# Patient Record
Sex: Male | Born: 1960
Health system: Southern US, Community
[De-identification: ages and names within clinical notes are randomized; demographics above are authoritative.]

## PROBLEM LIST (undated history)

## (undated) DIAGNOSIS — T7840XA Allergy, unspecified, initial encounter: Secondary | ICD-10-CM

## (undated) DIAGNOSIS — N183 Chronic kidney disease, stage 3 (moderate): Secondary | ICD-10-CM

## (undated) DIAGNOSIS — F419 Anxiety disorder, unspecified: Secondary | ICD-10-CM

## (undated) DIAGNOSIS — I10 Essential (primary) hypertension: Secondary | ICD-10-CM

## (undated) DIAGNOSIS — E291 Testicular hypofunction: Secondary | ICD-10-CM

## (undated) HISTORY — PX: SHOULDER SURGERY: SHX246

## (undated) HISTORY — DX: Chronic kidney disease, stage 3 (moderate): N18.3

## (undated) HISTORY — PX: COLONOSCOPY: SHX174

## (undated) HISTORY — DX: Testicular hypofunction: E29.1

## (undated) HISTORY — DX: Anxiety disorder, unspecified: F41.9

## (undated) HISTORY — DX: Essential (primary) hypertension: I10

## (undated) HISTORY — DX: Allergy, unspecified, initial encounter: T78.40XA

---

## 2008-11-16 ENCOUNTER — Ambulatory Visit: Payer: Self-pay | Admitting: Family Medicine

## 2008-11-16 DIAGNOSIS — I10 Essential (primary) hypertension: Secondary | ICD-10-CM | POA: Insufficient documentation

## 2008-11-16 DIAGNOSIS — F528 Other sexual dysfunction not due to a substance or known physiological condition: Secondary | ICD-10-CM | POA: Insufficient documentation

## 2008-11-17 ENCOUNTER — Ambulatory Visit: Payer: Self-pay | Admitting: Family Medicine

## 2008-11-19 ENCOUNTER — Encounter: Payer: Self-pay | Admitting: Family Medicine

## 2008-11-24 LAB — CONVERTED CEMR LAB
ALT: 34 units/L (ref 0–53)
AST: 38 units/L — ABNORMAL HIGH (ref 0–37)
Albumin: 3.8 g/dL (ref 3.5–5.2)
Alkaline Phosphatase: 56 units/L (ref 39–117)
BUN: 24 mg/dL — ABNORMAL HIGH (ref 6–23)
Basophils Relative: 0 % (ref 0.0–3.0)
CO2: 34 meq/L — ABNORMAL HIGH (ref 19–32)
Calcium: 9.3 mg/dL (ref 8.4–10.5)
Cholesterol: 93 mg/dL (ref 0–200)
Creatinine, Ser: 1.7 mg/dL — ABNORMAL HIGH (ref 0.4–1.5)
Eosinophils Absolute: 0.1 10*3/uL (ref 0.0–0.7)
Lymphocytes Relative: 33.3 % (ref 12.0–46.0)
MCHC: 33.5 g/dL (ref 30.0–36.0)
MCV: 97.4 fL (ref 78.0–100.0)
Monocytes Absolute: 0.6 10*3/uL (ref 0.1–1.0)
Neutrophils Relative %: 51.4 % (ref 43.0–77.0)
Platelets: 228 10*3/uL (ref 150.0–400.0)
RBC: 5.11 M/uL (ref 4.22–5.81)
Sex Hormone Binding: 28 nmol/L (ref 13–71)
Testosterone Free: 319.3 pg/mL — ABNORMAL HIGH (ref 47.0–244.0)
Testosterone: 1395.28 ng/dL — ABNORMAL HIGH (ref 350.00–890.00)
Testosterone: 1173.28 ng/dL — ABNORMAL HIGH (ref 350–890)
WBC: 5 10*3/uL (ref 4.5–10.5)

## 2008-12-10 ENCOUNTER — Ambulatory Visit: Payer: Self-pay | Admitting: Family Medicine

## 2008-12-12 LAB — CONVERTED CEMR LAB
BUN: 29 mg/dL — ABNORMAL HIGH (ref 6–23)
Calcium: 8.9 mg/dL (ref 8.4–10.5)
Creatinine, Ser: 1.6 mg/dL — ABNORMAL HIGH (ref 0.4–1.5)
GFR calc non Af Amer: 49.09 mL/min (ref >60)
Glucose, Bld: 85 mg/dL (ref 70–99)
Potassium: 4.1 meq/L (ref 3.5–5.1)

## 2008-12-13 LAB — CONVERTED CEMR LAB: Testosterone: 1710.14 ng/dL — ABNORMAL HIGH (ref 350–890)

## 2008-12-14 ENCOUNTER — Ambulatory Visit: Payer: Self-pay | Admitting: Family Medicine

## 2009-01-20 ENCOUNTER — Encounter: Payer: Self-pay | Admitting: Family Medicine

## 2009-01-24 ENCOUNTER — Encounter: Payer: Self-pay | Admitting: Family Medicine

## 2009-01-24 ENCOUNTER — Ambulatory Visit: Payer: Self-pay | Admitting: Nephrology

## 2009-10-28 ENCOUNTER — Ambulatory Visit: Payer: Self-pay | Admitting: Family Medicine

## 2010-03-23 ENCOUNTER — Ambulatory Visit
Admission: RE | Admit: 2010-03-23 | Discharge: 2010-03-23 | Payer: Self-pay | Source: Home / Self Care | Attending: Family Medicine | Admitting: Family Medicine

## 2010-03-23 DIAGNOSIS — J019 Acute sinusitis, unspecified: Secondary | ICD-10-CM | POA: Insufficient documentation

## 2010-03-23 DIAGNOSIS — J309 Allergic rhinitis, unspecified: Secondary | ICD-10-CM | POA: Insufficient documentation

## 2010-04-18 NOTE — Assessment & Plan Note (Signed)
Summary: FEVER ST/MK   Vital Signs:  Patient profile:   50 year old male Height:      68 inches Weight:      206.0 pounds BMI:     31.44 Temp:     98.5 degrees F oral Pulse rate:   76 / minute Pulse rhythm:   regular BP sitting:   140 / 90  (left arm) Cuff size:   large  Vitals Entered By: Benny Lennert CMA Duncan Dull) (October 28, 2009 9:26 AM)  History of Present Illness: Chief complaint fever,chills,scratchy throat,nausea  Acute Visit History:      The patient complains of fever, nasal discharge, and sore throat.  These symptoms began 4 days ago.  He denies constipation, cough, diarrhea, earache, sinus problems, and vomiting.  Other comments include: weakness, fatigue  nausea  right shoulder pain, right rib pain starting this AM..increases with deep breath.  using ibuprofen as needed pain/fever son with similar illness several weeks ago...strep throat.        His highest temperature has been low grade 100.6.  The fever has been chills.        Problems Prior to Update: 1)  Testosterone, Serum, Elevated  (ICD-790.99) 2)  Nonspecific Abnorm Results Kidney Function Study  (ICD-794.4) 3)  Erectile Dysfunction  (ICD-302.72) 4)  Encounter For Long-term Use of Other Medications  (ICD-V58.69) 5)  Screening For Lipoid Disorders  (ICD-V77.91) 6)  Hypertension  (ICD-401.9)  Current Medications (verified): 1)  Lisinopril 40 Mg Tabs (Lisinopril) .Marland Kitchen.. 1 By Mouth Once Daily 2)  Fexofenadine Hcl 180 Mg Tabs (Fexofenadine Hcl) .... One A Day As Needed Allergies 3)  Multivitamins  Caps (Multiple Vitamin) .... One A Day 4)  Vitamin B12 .... One A Day 5)  Fish Oil .... One A Day 6)  Postassium .... One A Day 7)  Androgel Pump 1 % Gel (Testosterone) .... 5.0 Grams Applied Daily To Skin, Disp 1 Month Supply 8)  Vitamin D3 9)  Cranberry 10)  Lysine 11)  Acidopholus 12)  Nasacort Aq 55 Mcg/act Aers (Triamcinolone Acetonide(Nasal)) .... 2 Sprays Each Nostril Daily 13)  Cialis 20 Mg Tabs  (Tadalafil) .Marland Kitchen.. 1 By Mouth Prior To Intercourse  Allergies (verified): No Known Drug Allergies  Past History:  Past medical, surgical, family and social histories (including risk factors) reviewed, and no changes noted (except as noted below).  Past Medical History: Reviewed history from 12/14/2008 and no changes required. HYPERTENSION (ICD-401.9) Erectile dysfunction  NO TESTOSTERONE PER DR. Patsy Lager  Past Surgical History: Reviewed history from 11/16/2008 and no changes required. 2006 Shoulder surgery (tendon)  Family History: Reviewed history from 11/16/2008 and no changes required. Family History Hypertension Family History of Cardiovascular disorder  Social History: Reviewed history from 11/16/2008 and no changes required. 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 Resp:  Denies shortness of breath, sputum productive, and wheezing. GI:  Denies abdominal pain. GU:  Denies dysuria.  Physical Exam  General:  fatigued appearing male in NAD Ears:  External ear exam shows no significant lesions or deformities.  Otoscopic examination reveals clear canals, tympanic membranes are intact bilaterally without bulging, retraction, inflammation or discharge. Hearing is grossly normal bilaterally. Nose:  External nasal examination shows no deformity or inflammation. Nasal mucosa are pink and moist without lesions or exudates. Mouth:  MMM Neck:  no carotid  bruit or thyromegaly no cervical or supraclavicular lymphadenopathy  Chest Wall:  ttp over right chest wall. Lungs:  Normal respiratory effort, chest expands symmetrically. Lungs are clear to auscultation, no crackles or wheezes. Heart:  Normal rate and regular rhythm. S1 and S2 normal without gallop, murmur, click, rub or other extra  sounds. Abdomen:  Bowel sounds positive,abdomen soft and non-tender without masses, organomegaly or hernias noted. Msk:  ttp over left AC joint..neg impingement sign Pulses:  R and L posterior tibial pulses are full and equal bilaterally    Impression & Recommendations:  Problem # 1:  VIRAL INFECTION (ICD-079.99)  Orders: Rapid Strep (16109) Rest, hydration. Discussed symptomatic relief. Call if not improving as expected.  No sign of bacterial infection.   Problem # 2:  SORE THROAT (ICD-462)  Orders: Rapid Strep (60454)  Complete Medication List: 1)  Lisinopril 40 Mg Tabs (Lisinopril) .Marland Kitchen.. 1 by mouth once daily 2)  Fexofenadine Hcl 180 Mg Tabs (Fexofenadine hcl) .... One a day as needed allergies 3)  Multivitamins Caps (Multiple vitamin) .... One a day 4)  Vitamin B12  .... One a day 5)  Fish Oil  .... One a day 6)  Postassium  .... One a day 7)  Androgel Pump 1 % Gel (Testosterone) .... 5.0 grams applied daily to skin, disp 1 month supply 8)  Vitamin D3  9)  Cranberry  10)  Lysine  11)  Acidopholus  12)  Nasacort Aq 55 Mcg/act Aers (Triamcinolone acetonide(nasal)) .... 2 sprays each nostril daily 13)  Cialis 20 Mg Tabs (Tadalafil) .Marland Kitchen.. 1 by mouth prior to intercourse  Patient Instructions: 1)  Push fluids, rest. 2)   Treat temperature with ibuprofen/tylenol. 3)   Call if not imrpoving in 5-7 days. 4)     Current Allergies (reviewed today): No known allergies    Laboratory Results    Other Tests  Rapid Strep: negative  Kit Test Internal QC: Negative   (Normal Range: Negative)

## 2010-04-20 NOTE — Assessment & Plan Note (Signed)
Summary: ?SINUS INFECTION,REFILL MEDS/CLE   Vital Signs:  Patient profile:   50 year old male Height:      68 inches Weight:      210 pounds BMI:     32.05 Temp:     97.7 degrees F oral Pulse rate:   76 / minute Pulse rhythm:   regular BP sitting:   120 / 86  (left arm) Cuff size:   large  Vitals Entered By: Benny Lennert CMA Duncan Dull) (March 23, 2010 12:25 PM)  History of Present Illness: Chief complaint ? sinus infection also needs medication refills  Allergic rhinitis: taking allegra and nasacort, but ran out of nasacort, doing worse. Ran out of nasacort  sinusitis: feels a lot of pressure behind eyes  a couple of days  about one a year.   test def, now getting depot testosterone injections every 2 weeks at urology.   HTN: stable, needs refills on meds   Acute Visit History:      The patient complains of cough, headache, nasal discharge, nausea, and sinus problems.  These symptoms began 4 days ago.  He complains of sinus pressure, teeth aching, ears being blocked, nasal congestion, purulent drainage, and frontal headache.  The patient has had a past history of sinusitis.  He denies sinusitis in the last 2 months.        Urine output has been normal.  He is tolerating clear liquids.        Allergies (verified): No Known Drug Allergies  Past History:  Past medical, surgical, family and social histories (including risk factors) reviewed, and no changes noted (except as noted below).  Past Medical History: HYPERTENSION (ICD-401.9) Erectile dysfunction Hypogonadism (being treated by Urology)  Past Surgical History: Reviewed history from 11/16/2008 and no changes required. 2006 Shoulder surgery (tendon)  Family History: Reviewed history from 11/16/2008 and no changes required. Family History Hypertension Family History of Cardiovascular disorder  Social History: Reviewed history from 11/16/2008 and no changes required. 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 CV: No chest pain or SOB GI: No noted N or V Otherwise, pertinent positives and negatives are noted in the HPI.   Physical Exam  General:  Well-developed,well-nourished,in no acute distress; alert,appropriate and cooperative throughout examination Head:  Normocephalic and atraumatic without obvious abnormalities. No apparent alopecia or balding. sinuses TTP, frontal, max Ears:  External ear exam shows no significant lesions or deformities.  Otoscopic examination reveals clear canals, tympanic membranes are intact bilaterally without bulging, retraction, inflammation or discharge. Hearing is grossly normal bilaterally. Mouth:  Oral mucosa and oropharynx without lesions or exudates.  Teeth in good repair. Neck:  No deformities, masses, or tenderness noted. Lungs:  Normal respiratory effort, chest expands symmetrically. Lungs are clear to auscultation, no crackles or wheezes. Heart:  Normal rate and regular rhythm. S1 and S2 normal without gallop, murmur, click, rub or other extra sounds. Extremities:  No clubbing, cyanosis, edema, or deformity noted with normal full range of motion of all joints.   Neurologic:  alert & oriented X3 and gait normal.   Cervical Nodes:  No lymphadenopathy noted Psych:  Cognition and judgment appear intact. Alert and cooperative with normal attention span and concentration. No apparent delusions, illusions, hallucinations   Impression & Recommendations:  Problem # 1:  SINUSITIS - ACUTE-NOS (ICD-461.9) Assessment New  His updated medication list for this problem includes:    Nasacort Aq 55 Mcg/act Aers (Triamcinolone acetonide(nasal)) .Marland Kitchen... 2 sprays each nostril daily    Azithromycin 250 Mg Tabs (Azithromycin) .Marland Kitchen... 2 by  mouth today and then  1 daily for 4 days  Instructed on treatment. Call if symptoms persist or worsen.   Problem # 2:  HYPERTENSION (ICD-401.9) Assessment: Unchanged  His updated medication list for this problem includes:    Lisinopril 40 Mg Tabs (Lisinopril) .Marland Kitchen... 1 by mouth once daily  Problem # 3:  ALLERGIC RHINITIS, CHRONIC (ICD-477.9) Assessment: Deteriorated  His updated medication list for this problem includes:    Fexofenadine Hcl 180 Mg Tabs (Fexofenadine hcl) ..... One a day as needed allergies    Nasacort Aq 55 Mcg/act Aers (Triamcinolone acetonide(nasal)) .Marland Kitchen... 2 sprays each nostril daily  Problem # 4:  ERECTILE DYSFUNCTION (ICD-302.72)  His updated medication list for this problem includes:    Viagra 100 Mg Tabs (Sildenafil citrate) .Marland Kitchen... 1 by mouth prior to intercourse  Complete Medication List: 1)  Lisinopril 40 Mg Tabs (Lisinopril) .Marland Kitchen.. 1 by mouth once daily 2)  Fexofenadine Hcl 180 Mg Tabs (Fexofenadine hcl) .... One a day as needed allergies 3)  Multivitamins Caps (Multiple vitamin) .... One a day 4)  Vitamin B12  .... One a day 5)  Fish Oil  .... One a day 6)  Postassium  .... One a day 7)  Vitamin D3  8)  Cranberry  9)  Lysine  10)  Acidopholus  11)  Nasacort Aq 55 Mcg/act Aers (Triamcinolone acetonide(nasal)) .... 2 sprays each nostril daily 12)  Viagra 100 Mg Tabs (Sildenafil citrate) .Marland Kitchen.. 1 by mouth prior to intercourse 13)  Depo-testosterone 200 Mg/ml Oil (Testosterone cypionate) .Marland Kitchen.. 1 shot every 2 weeks (done by urology) 14)  Azithromycin 250 Mg Tabs (Azithromycin) .... 2 by  mouth today and then 1 daily for 4 days Prescriptions: AZITHROMYCIN 250 MG  TABS (AZITHROMYCIN) 2 by  mouth today and then 1 daily for 4 days  #6 x 0   Entered and Authorized by:   Hannah Beat MD   Signed by:   Hannah Beat MD on 03/23/2010   Method used:   Electronically to        [facility] [phone]* (retail)       [address] [place], [place]  [address]        Ph: [phone] or [phone]       Fax: [phone]   RxID:   [id number] VIAGRA 100 MG TABS (SILDENAFIL CITRATE) 1 by mouth prior to intercourse  #4 x 11   Entered and Authorized by:   Hannah Beat MD   Signed by:   Hannah Beat MD on 03/23/2010   Method used:   Electronically to        [facility] [phone]* (retail)       [address] [place], [place]  [address]       Ph: [phone] or [phone]       Fax: [phone]   RxID:   [id number] NASACORT AQ 55 MCG/ACT AERS (TRIAMCINOLONE ACETONIDE(NASAL)) 2 sprays each nostril daily  #1 x 11   Entered and Authorized by:   Hannah Beat MD   Signed by:  Hannah Beat MD on 03/23/2010   Method used:   Electronically to        [facility] [phone]* (retail)       [address] [place], [place]  [address]       Ph: [phone] or [phone]       Fax: [phone]   RxID:   [id number] FEXOFENADINE HCL 180 MG TABS (FEXOFENADINE HCL) one a day as needed allergies  #30 x 11   Entered and Authorized by:   Hannah Beat MD   Signed by:   Hannah Beat MD on 03/23/2010   Method used:   Electronically to        [facility] [phone]* (retail)       [address] [place], [place]  [address]       Ph: [phone] or [phone]       Fax: [phone]   RxID:   [id number] LISINOPRIL 40 MG TABS (LISINOPRIL) 1 by mouth once daily  #30 Tablet x 11   Entered and Authorized by:   Hannah Beat MD   Signed by:   Hannah Beat MD on 03/23/2010   Method used:   Electronically to        [facility] [phone]* (retail)       [address] [place], [place]  [address]       Ph: [phone] or [phone]       Fax: [phone]   RxID:   [id number]    Orders Added: 1)  Est. Patient Level IV [[address]]    Current Allergies (reviewed today): No known allergies

## 2010-11-07 ENCOUNTER — Ambulatory Visit (INDEPENDENT_AMBULATORY_CARE_PROVIDER_SITE_OTHER): Payer: 59 | Admitting: Family Medicine

## 2010-11-07 ENCOUNTER — Encounter: Payer: Self-pay | Admitting: Family Medicine

## 2010-11-07 VITALS — BP 140/86 | HR 82 | Temp 97.7°F | Ht 68.0 in | Wt 211.1 lb

## 2010-11-07 DIAGNOSIS — F5104 Psychophysiologic insomnia: Secondary | ICD-10-CM | POA: Insufficient documentation

## 2010-11-07 DIAGNOSIS — G4700 Insomnia, unspecified: Secondary | ICD-10-CM | POA: Insufficient documentation

## 2010-11-07 DIAGNOSIS — F410 Panic disorder [episodic paroxysmal anxiety] without agoraphobia: Secondary | ICD-10-CM | POA: Insufficient documentation

## 2010-11-07 MED ORDER — LORAZEPAM 0.5 MG PO TABS: 0.5000 mg | ORAL_TABLET | Freq: Three times a day (TID) | ORAL | Status: AC | PRN

## 2010-11-07 NOTE — Patient Instructions (Addendum)
Insomnia:  Melatonin: 3 - 6 mg, take 1 hour before bed, the same time each night. This is safe to to every day and can help regulate sleep cycle.  Benadryl: up to 50 mg 30 minutes before sleep Or Unisom (Doxylamine) - 1 tablet 30 minutes before sleep -- These are both fairly safe medications, anti-histamines, and can be taken at night before bed.

## 2010-11-07 NOTE — Progress Notes (Signed)
  Subjective:    Patient ID: Max Peters, male    DOB: May 30, 1960, 50 y.o.   MRN: [id number]  HPI  Max Peters, a 50 y.o. male presents today in the office for the following:    For about six months, will wake up in the middle of the night. Will wake up around 3 oclock. Will wake up and feel all anxious. Nervous, like cannot breath. Will get up and go, get out of the bed. Sit in the easy chair. When sleepy in the chair -- about a couple of times a moth. For the past 2 days has happened 2 nights in a row, woke up at 3. Panicky, like cannot breath. Anxious -- almost like chest pain. When calms down, it will be OK. Has been ongoing for about 2 times a month for 6 months  Took a promotion about six months ago. Has happened two night in the -   Took a combo with GABA, melatonin and others that did not help last night  The PMH, PSH, Social History, Family History, Medications, and allergies have been reviewed in [facility], and have been updated if relevant.  Review of Systems ROS: GEN: No acute illnesses, no fevers, chills. GI: No n/v/d, eating normally Pulm: No SOB Interactive and getting along well at home.  Otherwise, ROS is as per the HPI.     Objective:   Physical Exam   Physical Exam  Blood pressure 140/86, pulse 82, temperature 97.7 F (36.5 C), temperature source Oral, height 5\' 8"  (1.727 m), weight 211 lb 1.9 oz (95.763 kg), SpO2 99.00%.  GEN: WDWN, NAD, Non-toxic, A & O x 3 HEENT: Atraumatic, Normocephalic. Neck supple. No masses, No LAD. Ears and Nose: No external deformity. EXTR: No c/c/e NEURO Normal gait.  PSYCH: Normally interactive. Conversant. Not depressed or anxious appearing.  Calm demeanor.       Assessment & Plan:   1. Panic attack   2. Insomnia    Classic panic attacks -- happening in night. Some associated mild insomnia. Instinctively has done some basic sleep hygiene.  Benadryl or Unisom the next few days to break cycle before bed.  Prn ativan for acute  attacks.   If worsens could use daily med

## 2010-11-30 ENCOUNTER — Encounter: Payer: Self-pay | Admitting: Family Medicine

## 2010-11-30 ENCOUNTER — Ambulatory Visit (INDEPENDENT_AMBULATORY_CARE_PROVIDER_SITE_OTHER): Payer: 59 | Admitting: Family Medicine

## 2010-11-30 VITALS — BP 120/76 | HR 84 | Temp 98.7°F | Wt 210.2 lb

## 2010-11-30 DIAGNOSIS — R3911 Hesitancy of micturition: Secondary | ICD-10-CM

## 2010-11-30 DIAGNOSIS — R300 Dysuria: Secondary | ICD-10-CM | POA: Insufficient documentation

## 2010-11-30 LAB — POCT URINALYSIS DIPSTICK
Glucose, UA: NEGATIVE
Nitrite, UA: POSITIVE
Urobilinogen, UA: 0.2
pH, UA: 6

## 2010-11-30 MED ORDER — SULFAMETHOXAZOLE-TMP DS 800-160 MG PO TABS: 1.0000 | ORAL_TABLET | Freq: Two times a day (BID) | ORAL | Status: AC

## 2010-11-30 NOTE — Progress Notes (Signed)
  Subjective:    Patient ID: Max Peters, male    DOB: 22-Dec-1960, 50 y.o.   MRN: [id number]  HPI CC: urinary sxs  4d h/o urinary sxs.  Over weekend on feet all day.  Monday morning when got up to work, left foot was very swollen.  Went to work still, then to gym.  Tuesday morning swelling improved.  Went to work, appetite decreased.  After lunch started with subjective fever, chills, lethargic, flu like sxs, trouble voiding with dysuria and incomplete emptying, testicular pain and abdominal pain described as gas pains improved with passing gas.  Went home and slept.  Tried ibuprofen and started azo pills.  Azo helped dysuria.  Stream back to normal.  Last night found leftover cipro 750mg  at home as well as took ibuprofen, azo again.  This am awoke with improved but still present abd pain, testicular pain, mild back pain.  Starting to feel better.  Several days constipated.  Has started having normal BMs again.  Tends to have 3-4 bm/day.  No blood in stool.  Tmax measured 99.5.  No significant back pain, no nausea/vomiting, no urethral discharge, monogamous relationship.  Diet high in protein, eg egg whites, protein bars.  H/o hypogonadism on testosterone cypionate 100mg  injection weekly.  To see Dr. Caryl Never at [facility] on Monday for f/u hypogonadism.  Never told has BPH.  Saw podiatrist yesterday, told normal foot exam.  Review of Systems Per HPI    Objective:   Physical Exam  Nursing note and vitals reviewed. Constitutional: He appears well-developed and well-nourished. No distress.  HENT:  Head: Normocephalic and atraumatic.  Mouth/Throat: Oropharynx is clear and moist. No oropharyngeal exudate.  Cardiovascular: Normal rate, regular rhythm and intact distal pulses.   No murmur heard. Pulmonary/Chest: Effort normal and breath sounds normal. No respiratory distress. He has no wheezes. He has no rales.  Abdominal: Soft. Bowel sounds are normal. He exhibits no distension and no mass.  There is no hepatosplenomegaly. There is no tenderness. There is no rigidity, no rebound, no guarding, no CVA tenderness and negative Murphy's sign. Hernia confirmed negative in the right inguinal area and confirmed negative in the left inguinal area.  Genitourinary: Rectum normal, prostate normal and penis normal. Rectal exam shows no external hemorrhoid, no fissure, no mass and anal tone normal. Right testis shows no mass, no swelling and no tenderness. Left testis shows no mass, no swelling and no tenderness. Circumcised. No phimosis.       Some hardened prostate. Some decreased testicle mass.  Musculoskeletal: He exhibits no edema.  Lymphadenopathy:       Right: No inguinal adenopathy present.       Left: No inguinal adenopathy present.  Skin: Skin is warm and dry. No rash noted.          Assessment & Plan:

## 2010-11-30 NOTE — Assessment & Plan Note (Addendum)
Lower tract sxs with abd pain, subjective fevers/chills. UA likely with infection, micro not too impressive, possibly 2/2 abx use yesterday.Marland Kitchen   Ucx sent.  No significant tenderness with palpation of prostate. Will treat as prostatitis with 2wk course of bactrim.  Continue NSAID. 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 Update if not improving as expected.

## 2010-11-30 NOTE — Patient Instructions (Signed)
Sounds like prostate infection.  Push fluids and plenty of rest.  May use ibuprofen for inflammation. Treat with bactrim DS twice daily for 2 weeks. Urine culture sent, we will send to your urologist when we receive it. Let us know if not improving as expected, or any worsening.

## 2010-12-18 ENCOUNTER — Ambulatory Visit: Payer: Self-pay | Admitting: Urology

## 2010-12-20 ENCOUNTER — Ambulatory Visit (INDEPENDENT_AMBULATORY_CARE_PROVIDER_SITE_OTHER): Payer: 59 | Admitting: Family Medicine

## 2010-12-20 ENCOUNTER — Encounter: Payer: Self-pay | Admitting: Family Medicine

## 2010-12-20 DIAGNOSIS — R059 Cough, unspecified: Secondary | ICD-10-CM | POA: Insufficient documentation

## 2010-12-20 DIAGNOSIS — R05 Cough: Secondary | ICD-10-CM

## 2010-12-20 DIAGNOSIS — G4700 Insomnia, unspecified: Secondary | ICD-10-CM

## 2010-12-20 DIAGNOSIS — H6120 Impacted cerumen, unspecified ear: Secondary | ICD-10-CM

## 2010-12-20 MED ORDER — BENZONATATE 100 MG PO CAPS: 100.0000 mg | ORAL_CAPSULE | Freq: Four times a day (QID) | ORAL | Status: DC | PRN

## 2010-12-20 NOTE — Progress Notes (Signed)
  Subjective:    Patient ID: Max Peters, male    DOB: 1960/05/31, 50 y.o.   MRN: [id number]  HPI Pt of Dr Cyndie Chime here as acute appt for cough. He has had a cough since Sun. Last night he was up all night coughing. He has used Mucinex. His OTC meds have not helped.  He denies fever or chills, headache, ear pain, rhinitis. He has had some ST this AM and occas at night since Sun. He minimally produces yellow sputum when coughing but coughs a lot. He demies N/V or diarrhea.  Sleep last night worse worse than usual.    Review of SystemsNoncontributory except as above.        Objective:   Physical Exam  Constitutional: He appears well-developed and well-nourished. No distress.  HENT:  Head: Normocephalic and atraumatic.  Nose: Nose normal.  Mouth/Throat: Oropharynx is clear and moist.       Completely occluded canals bilat with yellow/tan cerumen.  Eyes: Conjunctivae and EOM are normal. Pupils are equal, round, and reactive to light. Right eye exhibits no discharge. Left eye exhibits no discharge.  Neck: Normal range of motion. Neck supple.  Cardiovascular: Normal rate and regular rhythm.   Pulmonary/Chest: Effort normal and breath sounds normal. He has no wheezes.  Lymphadenopathy:    He has no cervical adenopathy.  Skin: He is not diaphoretic.          Assessment & Plan:

## 2010-12-20 NOTE — Assessment & Plan Note (Signed)
See instructions.  Doubt Lisinopril part of the problem.

## 2010-12-20 NOTE — Assessment & Plan Note (Addendum)
Discussed at length. Pt clearly wanted an acute medication other than Melatonin for sleep. He was not terribly receptive to sleep hygiene efforts or sleep cycle readjustment technique. I did discuss this at length.

## 2010-12-20 NOTE — Assessment & Plan Note (Signed)
Bilat. Discussed home irrigation technique.

## 2010-12-20 NOTE — Patient Instructions (Addendum)
Take Guaifenesin (400mg ), take 11/2 tabs by mouth AM and NOON. Get GUAIFENESIN by  [facility] and getting MUCOUS RELIEF EXPECTORANT/CONGESTION. DO NOT GET MUCINEX (Timed Release Guaifenesin)  Drink fluids. Take Tessalon at night. Keep lozenge in mouth all day for next week. Gargle with 30ccs of warm salt water every half hour for 2 days as able..  Irrigate canals as instructed to clear cerumen and keep clear.

## 2010-12-27 ENCOUNTER — Encounter: Payer: Self-pay | Admitting: Family Medicine

## 2010-12-27 ENCOUNTER — Ambulatory Visit (INDEPENDENT_AMBULATORY_CARE_PROVIDER_SITE_OTHER): Payer: 59 | Admitting: Family Medicine

## 2010-12-27 VITALS — BP 130/82 | HR 75 | Temp 97.6°F | Ht 68.0 in | Wt 204.1 lb

## 2010-12-27 DIAGNOSIS — G4700 Insomnia, unspecified: Secondary | ICD-10-CM

## 2010-12-27 DIAGNOSIS — F410 Panic disorder [episodic paroxysmal anxiety] without agoraphobia: Secondary | ICD-10-CM

## 2010-12-27 DIAGNOSIS — J209 Acute bronchitis, unspecified: Secondary | ICD-10-CM

## 2010-12-27 MED ORDER — AZITHROMYCIN 250 MG PO TABS: ORAL_TABLET | ORAL | Status: AC

## 2010-12-27 MED ORDER — HYDROCODONE-HOMATROPINE 5-1.5 MG/5ML PO SYRP: ORAL_SOLUTION | ORAL | Status: AC

## 2010-12-27 MED ORDER — AMITRIPTYLINE HCL 10 MG PO TABS: 10.0000 mg | ORAL_TABLET | Freq: Every day | ORAL | Status: DC

## 2010-12-27 NOTE — Patient Instructions (Signed)
F/u 4-6 weeks

## 2010-12-27 NOTE — Progress Notes (Signed)
  Subjective:    Patient ID: Max Peters, male    DOB: February 08, 1961, 50 y.o.   MRN: [id number]  HPI  Bonner Larue, a 50 y.o. male presents today in the office for the following:    Sick, coughing and productive of sputum. Yellowish in coloration. Having minimal nasal congestion at this point. No ear pain or sore throat. > 10 days now, really bad at night. Already having some sleeping problems. Had een taking some mucinex, but did not help. Also took some nyquil. Plain robitussin, then take in the morning and felt bad still in the afternoon. Using some Tessalon.  Still coughing up some phlegm - some yellowish tint. Cough is more productive.   He continues to have some significant difficulty sleeping, some panic attacks in the middle of the night. He does sleep with the television on with no sound. Last time, given some Ativan to use when he is having an acute panic attack, and he didn't really think that it helped very much. He has not done any counseling. No antidepressants. He has and still continues to exercise very frequently.  The PMH, PSH, Social History, Family History, Medications, and allergies have been reviewed in [facility], and have been updated if relevant.   Review of Systems ROS: GEN: Acute illness details above GI: Tolerating PO intake GU: maintaining adequate hydration and urination Pulm: No SOB Interactive and getting along well at home.  Otherwise, ROS is as per the HPI.     Objective:   Physical Exam   Physical Exam  Blood pressure 130/82, pulse 75, temperature 97.6 F (36.4 C), temperature source Oral, height 5\' 8"  (1.727 m), weight 204 lb 1.9 oz (92.588 kg), SpO2 98.00%.  GEN: A and O x 3. WDWN. NAD.    ENT: Nose clear, ext NML.  No LAD.  No JVD.  TM's clear. Oropharynx clear.  PULM: Normal WOB, no distress. No crackles, wheezes, rhonchi. CV: RRR, no M/G/R, No rubs, No JVD.   ABD: S, NT, ND, + BS. No rebound. No guarding. No HSM.   EXT: warm and well-perfused, No  c/c/e. PSYCH: Pleasant and conversant.       Assessment & Plan:   1. Bronchitis, acute  HYDROcodone-homatropine (HYCODAN) 5-1.5 MG/5ML syrup, azithromycin (ZITHROMAX Z-PAK) 250 MG tablet  2. Panic attack    3. Insomnia      Ongoing symptoms greater than 10 days, treat and cover for atypical organisms. Reasonable to use Hycodan at night.  He continues to have problems sleeping, occasional panic attacks at nighttime mostly. A great deal of stress at work. He is doing a lot of things right in terms of his exercise patterns, and he is at least aware of sleep hygiene. He is doing everything perfectly, but he is trying. We're to do a trial of low-dose amitriptyline. Certainly, think that going up to 25 in short order is appropriate also. If he feels some benefit, we could titrate up the dose.

## 2011-03-24 ENCOUNTER — Other Ambulatory Visit: Payer: Self-pay | Admitting: Family Medicine

## 2011-04-02 ENCOUNTER — Other Ambulatory Visit: Payer: Self-pay | Admitting: Family Medicine

## 2011-04-26 ENCOUNTER — Other Ambulatory Visit: Payer: Self-pay | Admitting: Family Medicine

## 2011-06-16 ENCOUNTER — Other Ambulatory Visit: Payer: Self-pay | Admitting: Family Medicine

## 2011-09-03 ENCOUNTER — Other Ambulatory Visit: Payer: Self-pay | Admitting: Family Medicine

## 2011-09-04 NOTE — Telephone Encounter (Signed)
refilled 

## 2011-09-26 ENCOUNTER — Telehealth: Payer: Self-pay | Admitting: *Deleted

## 2011-09-26 MED ORDER — FLUTICASONE PROPIONATE 50 MCG/ACT NA SUSP: 2.0000 | Freq: Every day | NASAL | Status: DC

## 2011-09-26 NOTE — Telephone Encounter (Signed)
Changed to flonase

## 2011-09-26 NOTE — Telephone Encounter (Signed)
Ok to change to flonase if ok with patient

## 2011-09-26 NOTE — Telephone Encounter (Signed)
Patient insurance will not cover nasacort and it is 144.00 out of pocket. Pharmacy wants to know if we can change to another nasal steroid.

## 2012-01-15 DIAGNOSIS — N529 Male erectile dysfunction, unspecified: Secondary | ICD-10-CM | POA: Insufficient documentation

## 2012-02-08 ENCOUNTER — Other Ambulatory Visit: Payer: Self-pay | Admitting: Family Medicine

## 2012-04-27 ENCOUNTER — Other Ambulatory Visit: Payer: Self-pay | Admitting: Family Medicine

## 2012-05-31 ENCOUNTER — Other Ambulatory Visit: Payer: Self-pay | Admitting: Family Medicine

## 2012-07-03 ENCOUNTER — Other Ambulatory Visit: Payer: Self-pay | Admitting: Family Medicine

## 2012-07-14 DIAGNOSIS — E291 Testicular hypofunction: Secondary | ICD-10-CM | POA: Insufficient documentation

## 2012-07-14 DIAGNOSIS — N400 Enlarged prostate without lower urinary tract symptoms: Secondary | ICD-10-CM | POA: Insufficient documentation

## 2012-07-29 ENCOUNTER — Other Ambulatory Visit: Payer: Self-pay | Admitting: Family Medicine

## 2012-08-01 ENCOUNTER — Other Ambulatory Visit: Payer: Self-pay | Admitting: Family Medicine

## 2012-09-13 ENCOUNTER — Other Ambulatory Visit: Payer: Self-pay | Admitting: Family Medicine

## 2012-10-09 ENCOUNTER — Other Ambulatory Visit: Payer: Self-pay | Admitting: Family Medicine

## 2012-10-10 NOTE — Telephone Encounter (Signed)
Patient not seen since 2012 and has been told to schedule appt but has not  Done so is it okay to refill?

## 2012-10-10 NOTE — Addendum Note (Signed)
Addended by: Consuello Masse on: 10/10/2012 07:40 AM   Modules accepted: Orders

## 2012-10-12 NOTE — Telephone Encounter (Signed)
Can you fill 30, 1 refill, just tell Max Peters he needs to come in for physical and i haven't seen him since 2012.

## 2012-10-13 ENCOUNTER — Telehealth: Payer: Self-pay | Admitting: Family Medicine

## 2012-10-13 MED ORDER — LISINOPRIL 40 MG PO TABS: ORAL_TABLET | ORAL | Status: DC

## 2012-10-13 NOTE — Telephone Encounter (Signed)
[facility] Triage Call Report Triage Record Num: [id number] Operator: Donnella Sham Patient Name: Meryl Ponder Call Date & Time: 10/10/2012 5:45:07PM Patient Phone: [phone] PCP: Patient Gender: Male PCP Fax : Patient DOB: 03/02/1961 Practice Name: [facility] Reason for Call: Caller: Yael/Patient; PCP: Hannah Beat (Family Practice); CB#: [phone]; Call regarding Medication refill; took last Lisinopril today; had pharmacy send over a request and said an OV would be needed; checked epic and found last OV 12/29/10; Fredrik Cove said pharmacy said they could give him enough pills to get through the w/e; will call office 10/13/12 for an OV for a full refill Protocol(s) Used: Office Note Recommended Outcome per Protocol: Information Noted and Sent to Office Reason for Outcome: Caller information to office Care Advice: ~ 10/10/2012 5:53:08PM Page 1 of 1 [id number]

## 2012-10-13 NOTE — Telephone Encounter (Signed)
Patient advised and will call for appt. Patients medication sent to pharmacy

## 2012-10-13 NOTE — Addendum Note (Signed)
Addended by: Consuello Masse on: 10/13/2012 11:09 AM   Modules accepted: Orders

## 2012-12-11 ENCOUNTER — Telehealth: Payer: Self-pay | Admitting: Family Medicine

## 2012-12-11 NOTE — Telephone Encounter (Signed)
CPE scheduled for 01/07/2013 @ 8am with Dr. Patsy Lager.

## 2013-01-05 ENCOUNTER — Other Ambulatory Visit (INDEPENDENT_AMBULATORY_CARE_PROVIDER_SITE_OTHER): Payer: 59

## 2013-01-05 DIAGNOSIS — Z125 Encounter for screening for malignant neoplasm of prostate: Secondary | ICD-10-CM

## 2013-01-05 DIAGNOSIS — Z79899 Other long term (current) drug therapy: Secondary | ICD-10-CM

## 2013-01-05 DIAGNOSIS — Z13220 Encounter for screening for lipoid disorders: Secondary | ICD-10-CM

## 2013-01-05 LAB — CBC WITH DIFFERENTIAL/PLATELET
Basophils Relative: 0.4 % (ref 0.0–3.0)
Eosinophils Relative: 3.7 % (ref 0.0–5.0)
HCT: 46.3 % (ref 39.0–52.0)
Hemoglobin: 15.8 g/dL (ref 13.0–17.0)
MCV: 95.9 fl (ref 78.0–100.0)
Monocytes Absolute: 0.7 10*3/uL (ref 0.1–1.0)
Monocytes Relative: 13.8 % — ABNORMAL HIGH (ref 3.0–12.0)
Neutrophils Relative %: 54.2 % (ref 43.0–77.0)
Platelets: 249 10*3/uL (ref 150.0–400.0)

## 2013-01-05 LAB — LIPID PANEL
Cholesterol: 109 mg/dL (ref 0–200)
HDL: 45.5 mg/dL (ref >39.00)
LDL Cholesterol: 57 mg/dL (ref 0–99)
Triglycerides: 33 mg/dL (ref 0.0–149.0)

## 2013-01-05 LAB — HEPATIC FUNCTION PANEL
ALT: 31 U/L (ref 0–53)
AST: 35 U/L (ref 0–37)
Bilirubin, Direct: 0.1 mg/dL (ref 0.0–0.3)
Total Bilirubin: 0.8 mg/dL (ref 0.3–1.2)
Total Protein: 6.5 g/dL (ref 6.0–8.3)

## 2013-01-05 LAB — PSA: PSA: 3.48 ng/mL (ref 0.10–4.00)

## 2013-01-05 LAB — BASIC METABOLIC PANEL
BUN: 24 mg/dL — ABNORMAL HIGH (ref 6–23)
CO2: 32 mEq/L (ref 19–32)
Chloride: 102 mEq/L (ref 96–112)
GFR: 50.1 mL/min — ABNORMAL LOW (ref >60.00)
Potassium: 4.9 mEq/L (ref 3.5–5.1)
Sodium: 138 mEq/L (ref 135–145)

## 2013-01-07 ENCOUNTER — Encounter: Payer: Self-pay | Admitting: Family Medicine

## 2013-01-07 ENCOUNTER — Encounter: Payer: Self-pay | Admitting: Internal Medicine

## 2013-01-07 ENCOUNTER — Ambulatory Visit (INDEPENDENT_AMBULATORY_CARE_PROVIDER_SITE_OTHER): Payer: 59 | Admitting: Family Medicine

## 2013-01-07 VITALS — BP 120/90 | HR 76 | Temp 98.3°F | Ht 67.0 in | Wt 190.5 lb

## 2013-01-07 DIAGNOSIS — Z0000 Encounter for general adult medical examination without abnormal findings: Secondary | ICD-10-CM

## 2013-01-07 DIAGNOSIS — Z23 Encounter for immunization: Secondary | ICD-10-CM

## 2013-01-07 DIAGNOSIS — Z1211 Encounter for screening for malignant neoplasm of colon: Secondary | ICD-10-CM

## 2013-01-07 MED ORDER — AMITRIPTYLINE HCL 10 MG PO TABS: ORAL_TABLET | ORAL | Status: DC

## 2013-01-07 MED ORDER — FLUTICASONE PROPIONATE 50 MCG/ACT NA SUSP: 2.0000 | Freq: Every day | NASAL | Status: DC

## 2013-01-07 MED ORDER — LISINOPRIL 40 MG PO TABS: ORAL_TABLET | ORAL | Status: DC

## 2013-01-07 NOTE — Progress Notes (Signed)
Date:  01/07/2013   Name:  Max Peters   DOB:  [birth year]   MRN:  [id number] Gender: male Age: 52 y.o.  Primary Physician:  Hannah Beat, MD   Chief Complaint: Annual Exam   History of Present Illness:  Max Peters is a 52 y.o. pleasant patient who presents with the following:  CPX: 128/85  Colonoscopy  Preventative Health Maintenance Visit:  Health Maintenance Summary Reviewed and updated, unless pt declines services.  Tobacco History Reviewed. Alcohol: about 1 a day Exercise Habits: works out most every day STD concerns: no risk or activity to increase risk Drug Use: None Encouraged self-testicular check  Health Maintenance  Topic Date Due  . Tetanus/tdap  01/14/1979  . Colonoscopy  01/13/2010  . Influenza Vaccine  10/17/2012    Labs reviewed with the patient.  Results for orders placed in visit on 01/05/13  LIPID PANEL      Result Value Range   Cholesterol 109  0 - 200 mg/dL   Triglycerides 46.9  0.0 - 149.0 mg/dL   HDL 62.95  >28.41 mg/dL   VLDL 6.6  0.0 - 32.4 mg/dL   LDL Cholesterol 57  0 - 99 mg/dL   Total CHOL/HDL Ratio 2    CBC WITH DIFFERENTIAL      Result Value Range   WBC 4.9  4.5 - 10.5 K/uL   RBC 4.82  4.22 - 5.81 Mil/uL   Hemoglobin 15.8  13.0 - 17.0 g/dL   HCT 40.1  02.7 - 25.3 %   MCV 95.9  78.0 - 100.0 fl   MCHC 34.1  30.0 - 36.0 g/dL   RDW 66.4 (*) 40.3 - 47.4 %   Platelets 249.0  150.0 - 400.0 K/uL   Neutrophils Relative % 54.2  43.0 - 77.0 %   Lymphocytes Relative 27.9  12.0 - 46.0 %   Monocytes Relative 13.8 (*) 3.0 - 12.0 %   Eosinophils Relative 3.7  0.0 - 5.0 %   Basophils Relative 0.4  0.0 - 3.0 %   Neutro Abs 2.7  1.4 - 7.7 K/uL   Lymphs Abs 1.4  0.7 - 4.0 K/uL   Monocytes Absolute 0.7  0.1 - 1.0 K/uL   Eosinophils Absolute 0.2  0.0 - 0.7 K/uL   Basophils Absolute 0.0  0.0 - 0.1 K/uL  HEPATIC FUNCTION PANEL      Result Value Range   Total Bilirubin 0.8  0.3 - 1.2 mg/dL   Bilirubin, Direct 0.1  0.0 - 0.3 mg/dL   Alkaline Phosphatase 61  39 - 117 U/L   AST 35  0 - 37 U/L   ALT 31  0 - 53 U/L   Total Protein 6.5  6.0 - 8.3 g/dL   Albumin 3.6  3.5 - 5.2 g/dL  BASIC METABOLIC PANEL      Result Value Range   Sodium 138  135 - 145 mEq/L   Potassium 4.9  3.5 - 5.1 mEq/L   Chloride 102  96 - 112 mEq/L   CO2 32  19 - 32 mEq/L   Glucose, Bld 99  70 - 99 mg/dL   BUN 24 (*) 6 - 23 mg/dL   Creatinine, Ser 1.6 (*) 0.4 - 1.5 mg/dL   Calcium 9.2  8.4 - 25.9 mg/dL   GFR 56.38 (*) >75.64 mL/min  PSA      Result Value Range   PSA 3.48  0.10 - 4.00 ng/mL     Patient Active Problem List   Diagnosis  Date Noted  . Panic attack 11/07/2010  . Insomnia 11/07/2010  . ALLERGIC RHINITIS, CHRONIC 03/23/2010  . HYPERTENSION 11/16/2008    Past Medical History  Diagnosis Date  . Hypertension   . Hypogonadism male     Past Surgical History  Procedure Laterality Date  . Shoulder surgery      History   Social History  . Marital Status: Married    Spouse Name: N/A    Number of Children: N/A  . Years of Education: N/A   Occupational History  . [employer]    Social History Main Topics  . Smoking status: Never Smoker   . Smokeless tobacco: Never Used  . Alcohol Use: Yes     Comment: occassionally  . Drug Use: No  . Sexual Activity: Not on file   Other Topics Concern  . Not on file   Social History Narrative   Regular exercise--yes    No family history on file.  No Known Allergies  Medication list has been reviewed and updated.  Outpatient Prescriptions Prior to Visit  Medication Sig Dispense Refill  . amitriptyline (ELAVIL) 10 MG tablet TAKE 1 TABLET AT BEDTIME  30 tablet  5  . fluticasone (FLONASE) 50 MCG/ACT nasal spray Place 2 sprays into the nose daily.  16 g  6  . lisinopril (PRINIVIL,ZESTRIL) 40 MG tablet TAKE 1 TABLET BY MOUTH EVERY DAY (NEEDS APPOINTMENT)  30 tablet  0  . testosterone cypionate (DEPO-TESTOSTERONE) 200 MG/ML injection Inject 100 mg into  the muscle once a week.       Marland Kitchen VIAGRA 100 MG tablet TAKE 1 TABLET BY MOUTH ONE HOUR PRIOR TO INTERCOURSE  4 tablet  6  . ibuprofen (ADVIL,MOTRIN) 800 MG tablet Take 800 mg by mouth every 8 (eight) hours as needed.        . [facility] ALLERGY RELIEF 180 MG tablet TAKE 1 TABLET EVERY DAY AS NEEDED FOR ALLERGY  30 tablet  9  . Melatonin (MELADOX) 3 MG TBCR Take 2 by mouth at bedtime       . NASACORT AQ 55 MCG/ACT nasal inhaler 2 SPRAYS EACH NOSTRIL DAILY  16.5 g  10   No facility-administered medications prior to visit.    Review of Systems:   General: Denies fever, chills, sweats. No significant weight loss. Eyes: Denies blurring,significant itching ENT: Denies earache, sore throat, and hoarseness. Cardiovascular: Denies chest pains, palpitations, dyspnea on exertion Respiratory: Denies cough, dyspnea at rest,wheeezing Breast: no concerns about lumps GI: Denies nausea, vomiting, diarrhea, constipation, change in bowel habits, abdominal pain, melena, hematochezia GU: Denies penile discharge, ED, urinary flow / outflow problems. No STD concerns. Musculoskeletal: Denies back pain, joint pain Derm: Denies rash, itching Neuro: Denies  paresthesias, frequent falls, frequent headaches Psych: Denies depression, anxiety Endocrine: Denies cold intolerance, heat intolerance, polydipsia Heme: Denies enlarged lymph nodes Allergy: No hayfever  Physical Examination: BP 120/90  Pulse 76  Temp(Src) 98.3 F (36.8 C) (Oral)  Ht 5\' 7"  (1.702 m)  Wt 190 lb 8 oz (86.41 kg)  BMI 29.83 kg/m2  Ideal Body Weight: Weight in (lb) to have BMI = 25: 159.3   Wt Readings from Last 3 Encounters:  01/07/13 190 lb 8 oz (86.41 kg)  12/27/10 204 lb 1.9 oz (92.588 kg)  12/20/10 204 lb 12 oz (92.874 kg)    GEN: well developed, well nourished, no acute distress Eyes: conjunctiva and lids normal, PERRLA, EOMI ENT: TM clear, nares clear, oral exam WNL Neck: supple, no lymphadenopathy,  no thyromegaly, no JVD Pulm:  clear to auscultation and percussion, respiratory effort normal CV: regular rate and rhythm, S1-S2, no murmur, rub or gallop, no bruits, peripheral pulses normal and symmetric, no cyanosis, clubbing, edema or varicosities Chest: no scars, masses GI: soft, non-tender; no hepatosplenomegaly, masses; active bowel sounds all quadrants GU: no hernia, testicular mass, penile discharge, or prostate enlargement Lymph: no cervical, axillary or inguinal adenopathy MSK: gait normal, muscle tone and strength WNL, no joint swelling, effusions, discoloration, crepitus  SKIN: clear, good turgor, color WNL, no rashes, lesions, or ulcerations Neuro: normal mental status, normal strength, sensation, and motion Psych: alert; oriented to person, place and time, normally interactive and not anxious or depressed in appearance.  Assessment and Plan:  Routine general medical examination at a health care facility  Special screening for malignant neoplasm of colon - Plan: Ambulatory referral to Gastroenterology  Need for prophylactic vaccination and inoculation against influenza - Plan: Flu Vaccine QUAD 36+ mos PF IM (Fluarix)  The patient's preventative maintenance and recommended screening tests for an annual wellness exam were reviewed in full today. Brought up to date unless services declined.  Counselled on the importance of diet, exercise, and its role in overall health and mortality. The patient's FH and SH was reviewed, including their home life, tobacco status, and drug and alcohol status.   Overall doing great. Did separate from his wife.   Orders Today:  Orders Placed This Encounter  Procedures  . Flu Vaccine QUAD 36+ mos PF IM (Fluarix)  . Ambulatory referral to Gastroenterology    Referral Priority:  Routine    Referral Type:  Consultation    Referral Reason:  Specialty Services Required    Requested Specialty:  Gastroenterology    Number of Visits Requested:  1    Updated Medication List:  (Includes new medications, updates to list, dose adjustments) Meds ordered this encounter  Medications  . Melatonin 10 MG TABS    Sig: Take 1 tablet by mouth at bedtime.  . silodosin (RAPAFLO) 4 MG CAPS capsule    Sig: Take 4 mg by mouth daily.    Medications Discontinued: Medications Discontinued During This Encounter  Medication Reason  . Melatonin (MELADOX) 3 MG TBCR Change in therapy  . NASACORT AQ 55 MCG/ACT nasal inhaler Change in therapy  . [facility] ALLERGY RELIEF 180 MG tablet No longer needed (for PRN medications)  . ibuprofen (ADVIL,MOTRIN) 800 MG tablet Patient Preference      Signed,  Anadelia Kintz T. Taima Rada, MD, CAQ Sports Medicine  [facility] [address] [place] [address] Phone: [phone] Fax: [phone]

## 2013-01-07 NOTE — Patient Instructions (Signed)
REFERRAL: GO THE THE FRONT ROOM AT THE ENTRANCE OF OUR CLINIC, NEAR CHECK IN. ASK FOR MARION. SHE WILL HELP YOU SET UP YOUR REFERRAL. DATE: TIME:  

## 2013-03-23 ENCOUNTER — Ambulatory Visit (AMBULATORY_SURGERY_CENTER): Payer: 59 | Admitting: *Deleted

## 2013-03-23 VITALS — Ht 67.0 in | Wt 201.2 lb

## 2013-03-23 DIAGNOSIS — Z1211 Encounter for screening for malignant neoplasm of colon: Secondary | ICD-10-CM

## 2013-03-23 MED ORDER — NA SULFATE-K SULFATE-MG SULF 17.5-3.13-1.6 GM/177ML PO SOLN
1.0000 | Freq: Once | ORAL | Status: DC
Start: 2013-03-23 — End: 2013-06-01

## 2013-03-23 NOTE — Progress Notes (Signed)
No egg or soy allergy. No anesthesia problems.  

## 2013-04-06 ENCOUNTER — Encounter: Payer: 59 | Admitting: Internal Medicine

## 2013-04-13 ENCOUNTER — Encounter: Payer: 59 | Admitting: Internal Medicine

## 2013-04-28 ENCOUNTER — Encounter: Payer: Self-pay | Admitting: Family Medicine

## 2013-04-28 ENCOUNTER — Ambulatory Visit (INDEPENDENT_AMBULATORY_CARE_PROVIDER_SITE_OTHER): Payer: 59 | Admitting: Family Medicine

## 2013-04-28 VITALS — BP 157/93 | HR 71 | Temp 97.8°F | Ht 67.0 in | Wt 200.0 lb

## 2013-04-28 DIAGNOSIS — J069 Acute upper respiratory infection, unspecified: Secondary | ICD-10-CM | POA: Insufficient documentation

## 2013-04-28 DIAGNOSIS — G4700 Insomnia, unspecified: Secondary | ICD-10-CM

## 2013-04-28 DIAGNOSIS — F410 Panic disorder [episodic paroxysmal anxiety] without agoraphobia: Secondary | ICD-10-CM

## 2013-04-28 MED ORDER — ALPRAZOLAM 0.25 MG PO TABS: 0.2500 mg | ORAL_TABLET | Freq: Every evening | ORAL | Status: DC | PRN

## 2013-04-28 NOTE — Patient Instructions (Addendum)
Mucinex DM twice daily. Ibuprofen 800 mg every eight hours for sor threat. Expect it to move to your chest and last 7-10 days of illness. Stop stimulant prior to exercsie at night.  Will provide short term prescription for alprazolam... For further refills or chroinic use.. Discuss with PCP. Make appt for follow up.

## 2013-04-28 NOTE — Assessment & Plan Note (Signed)
He is afraid to go to bed at night for fear of panic attack.  Xanax has helped. Discussed concerns about this type of med and how typically used in short term. Will prescribe short term xanax... For further treatment or further refills needs to follow up with Dr. Patsy [place]ager[place].

## 2013-04-28 NOTE — Assessment & Plan Note (Signed)
No bacterial infection.  Dicussed viral time course and symptom care.

## 2013-04-28 NOTE — Progress Notes (Signed)
Pre-visit discussion using our clinic review tool. No additional management support is needed unless otherwise documented below in the visit note.  

## 2013-04-28 NOTE — Progress Notes (Signed)
   Subjective:    Patient ID: Max PacerRoger Peters, male    DOB: Nov 26, 1960, [age] y.o.   MRN: [id number]  Sore Throat  This is a new problem. The current episode started in the past 7 days. The problem has been gradually worsening. Neither side of throat is experiencing more pain than the other. There has been no fever. The pain is moderate. Associated symptoms include congestion, a hoarse voice, swollen glands and trouble swallowing. Pertinent negatives include no coughing, drooling, ear discharge, ear pain, plugged ear sensation, neck pain, shortness of breath or vomiting. He has had no exposure to strep or mono. He has tried NSAIDs for the symptoms. The treatment provided moderate relief.   Insomnia, chronic: Panic attacks at night after bad dreams. Amitriptyline 10 mg has not helped. No SE. Helped some initially. Tried some xanax from friend... Helped him more, felt more refreshed.  No issue during the day.  Takes stimulant before working out at night!!!!    Review of Systems  HENT: Positive for congestion, hoarse voice and trouble swallowing. Negative for drooling, ear discharge and ear pain.   Respiratory: Negative for cough and shortness of breath.   Gastrointestinal: Negative for vomiting.  Musculoskeletal: Negative for neck pain.       Objective:   Physical Exam  Constitutional: Vital signs are normal. He appears well-developed and well-nourished.  Non-toxic appearance. He does not appear ill. No distress.  HENT:  Head: Normocephalic and atraumatic.  Right Ear: Hearing, tympanic membrane, external ear and ear canal normal. No tenderness. No foreign bodies. Tympanic membrane is not retracted and not bulging.  Left Ear: Hearing, tympanic membrane, external ear and ear canal normal. No tenderness. No foreign bodies. Tympanic membrane is not retracted and not bulging.  Nose: Nose normal. No mucosal edema or rhinorrhea. Right sinus exhibits no maxillary sinus tenderness and no frontal sinus  tenderness. Left sinus exhibits no maxillary sinus tenderness and no frontal sinus tenderness.  Mouth/Throat: Uvula is midline, oropharynx is clear and moist and mucous membranes are normal. Normal dentition. No dental caries. No oropharyngeal exudate or tonsillar abscesses.  Eyes: Conjunctivae, EOM and lids are normal. Pupils are equal, round, and reactive to light. Lids are everted and swept, no foreign bodies found.  Neck: Trachea normal, normal range of motion and phonation normal. Neck supple. Carotid bruit is not present. No mass and no thyromegaly present.  Cardiovascular: Normal rate, regular rhythm, S1 normal, S2 normal, normal heart sounds, intact distal pulses and normal pulses.  Exam reveals no gallop, no distant heart sounds and no friction rub.   No murmur heard. No peripheral edema  Pulmonary/Chest: Effort normal and breath sounds normal. No respiratory distress. He has no wheezes. He has no rhonchi. He has no rales.  Abdominal: Soft. Normal appearance and bowel sounds are normal. There is no hepatosplenomegaly. There is no tenderness. There is no rebound, no guarding and no CVA tenderness. No hernia.  Neurological: He is alert. He has normal reflexes.  Skin: Skin is warm, dry and intact. No rash noted.  Psychiatric: He has a normal mood and affect. His speech is normal and behavior is normal. Judgment and thought content normal.          Assessment & Plan:

## 2013-04-28 NOTE — Assessment & Plan Note (Signed)
Poor sleep hygeine. Encouraged his to stop stimulant and exercise in AM.

## 2013-06-01 ENCOUNTER — Encounter: Payer: Self-pay | Admitting: Internal Medicine

## 2013-06-01 ENCOUNTER — Ambulatory Visit (AMBULATORY_SURGERY_CENTER): Payer: 59 | Admitting: Internal Medicine

## 2013-06-01 VITALS — BP 144/84 | HR 77 | Temp 97.9°F | Resp 12 | Ht 67.0 in | Wt 201.0 lb

## 2013-06-01 DIAGNOSIS — K5730 Diverticulosis of large intestine without perforation or abscess without bleeding: Secondary | ICD-10-CM

## 2013-06-01 DIAGNOSIS — Z1211 Encounter for screening for malignant neoplasm of colon: Secondary | ICD-10-CM

## 2013-06-01 MED ORDER — SODIUM CHLORIDE 0.9 % IV SOLN: 500.0000 mL | INTRAVENOUS | Status: DC

## 2013-06-01 NOTE — Progress Notes (Signed)
Procedure ends, to recovery, report given and VSS. 

## 2013-06-01 NOTE — Patient Instructions (Addendum)
No polyps or cancer seen.  You do have a condition called diverticulosis - common and not usually a problem. Please read the handout provided.  Next routine colonoscopy in 10 years - 2025  I appreciate the opportunity to care for you. Iva Booparl E. Jozalynn Noyce, MD, FACG   YOU HAD AN ENDOSCOPIC PROCEDURE TODAY AT THE [facility]: Refer to the procedure report that was given to you for any specific questions about what was found during the examination.  If the procedure report does not answer your questions, please call your gastroenterologist to clarify.  If you requested that your care partner not be given the details of your procedure findings, then the procedure report has been included in a sealed envelope for you to review at your convenience later.  YOU SHOULD EXPECT: Some feelings of bloating in the abdomen. Passage of more gas than usual.  Walking can help get rid of the air that was put into your GI tract during the procedure and reduce the bloating. If you had a lower endoscopy (such as a colonoscopy or flexible sigmoidoscopy) you may notice spotting of blood in your stool or on the toilet paper. If you underwent a bowel prep for your procedure, then you may not have a normal bowel movement for a few days.  DIET: Your first meal following the procedure should be a light meal and then it is ok to progress to your normal diet.  A half-sandwich or bowl of soup is an example of a good first meal.  Heavy or fried foods are harder to digest and may make you feel nauseous or bloated.  Likewise meals heavy in dairy and vegetables can cause extra gas to form and this can also increase the bloating.  Drink plenty of fluids but you should avoid alcoholic beverages for 24 hours.  ACTIVITY: Your care partner should take you home directly after the procedure.  You should plan to take it easy, moving slowly for the rest of the day.  You can resume normal activity the day after the procedure however you  should NOT DRIVE or use heavy machinery for 24 hours (because of the sedation medicines used during the test).    SYMPTOMS TO REPORT IMMEDIATELY: A gastroenterologist can be reached at any hour.  During normal business hours, 8:30 AM to 5:00 PM Monday through Friday, call [phone].  After hours and on weekends, please call the GI answering service at [phone] who will take a message and have the physician on call contact you.   Following lower endoscopy (colonoscopy or flexible sigmoidoscopy):  Excessive amounts of blood in the stool  Significant tenderness or worsening of abdominal pains  Swelling of the abdomen that is new, acute  Fever of 100F or higher  Following upper endoscopy (EGD)  Vomiting of blood or coffee ground material  New chest pain or pain under the shoulder blades  Painful or persistently difficult swallowing  New shortness of breath  Fever of 100F or higher  Black, tarry-looking stools  FOLLOW UP: If any biopsies were taken you will be contacted by phone or by letter within the next 1-3 weeks.  Call your gastroenterologist if you have not heard about the biopsies in 3 weeks.  Our staff will call the home number listed on your records the next business day following your procedure to check on you and address any questions or concerns that you may have at that time regarding the information given to you following your  procedure. This is a courtesy call and so if there is no answer at the home number and we have not heard from you through the emergency physician on call, we will assume that you have returned to your regular daily activities without incident.  SIGNATURES/CONFIDENTIALITY: You and/or your care partner have signed paperwork which will be entered into your electronic medical record.  These signatures attest to the fact that that the information above on your After Visit Summary has been reviewed and is understood.  Full responsibility of the  confidentiality of this discharge information lies with you and/or your care-partner.  Information on diverticulosis given to you today

## 2013-06-01 NOTE — Op Note (Signed)
[facility] [address] [place], [id number]   COLONOSCOPY PROCEDURE REPORT  PATIENT: Max Peters, Max Peters  MR#: [id number] BIRTHDATE: 09/10/1960 , 52  yrs. old GENDER: Male ENDOSCOPIST: Iva Booparl E Gessner, MD, [facility] REFERRED XB:[id number]:Spencer Ward Chatters Copland, M.D. PROCEDURE DATE:  06/01/2013 PROCEDURE:   Colonoscopy, screening First Screening Colonoscopy - Avg.  risk and is 50 yrs.  old or older Yes.  Prior Negative Screening - Now for repeat screening. N/A  History of Adenoma - Now for follow-up colonoscopy & has been > or = to 3 yrs.  N/A  Polyps Removed Today? No.  Recommend repeat exam, <10 yrs? No. ASA CLASS:   Class II INDICATIONS:average risk screening and first colonoscopy. MEDICATIONS: propofol (Diprivan) 300mg  IV, MAC sedation, administered by CRNA, and These medications were titrated to patient response per physician's verbal order  DESCRIPTION OF PROCEDURE:   After the risks benefits and alternatives of the procedure were thoroughly explained, informed consent was obtained.  A digital rectal exam revealed no abnormalities of the rectum, A digital rectal exam revealed no prostatic nodules, and A digital rectal exam revealed the prostate was not enlarged.   The     endoscope was introduced through the anus and advanced to the cecum, which was identified by both the appendix and ileocecal valve. No adverse events experienced.   The quality of the prep was excellent using Suprep  The instrument was then slowly withdrawn as the colon was fully examined.   COLON FINDINGS: Mild diverticulosis was noted in the sigmoid colon. The colon mucosa was otherwise normal.   A right colon retroflexion was performed.  Retroflexed views revealed no abnormalities. The time to cecum=2 minutes 09 seconds.  Withdrawal time=6 minutes 48 seconds.  The scope was withdrawn and the procedure completed. COMPLICATIONS: There were no complications.  ENDOSCOPIC IMPRESSION: 1.   Mild diverticulosis was  noted in the sigmoid colon 2.   The colon mucosa was otherwise normal - excellent prep - first colonoscopy  RECOMMENDATIONS: Repeat colonoscopy 10 years - 2025   eSigned:  Iva Booparl E Gessner, MD, [facility] 06/01/2013 11:59 AM   cc: Juleen ChinaSpencer T Copland, MD and The Patient

## 2013-06-02 ENCOUNTER — Telehealth: Payer: Self-pay | Admitting: *Deleted

## 2013-06-02 NOTE — Telephone Encounter (Signed)
  Follow up Call-  Call back number 06/01/2013  Post procedure Call Back phone  # [phone]  Permission to leave phone message Yes     Patient questions:  Do you have a fever, pain , or abdominal swelling? no Pain Score  0 *  Have you tolerated food without any problems? yes  Have you been able to return to your normal activities? yes  Do you have any questions about your discharge instructions: Diet   no Medications  no Follow up visit  no  Do you have questions or concerns about your Care? no  Actions: * If pain score is 4 or above: No action needed, pain <4.

## 2013-08-17 ENCOUNTER — Ambulatory Visit (INDEPENDENT_AMBULATORY_CARE_PROVIDER_SITE_OTHER): Payer: 59 | Admitting: Family Medicine

## 2013-08-17 ENCOUNTER — Encounter: Payer: Self-pay | Admitting: Family Medicine

## 2013-08-17 VITALS — BP 130/90 | HR 78 | Temp 98.3°F | Ht 67.0 in | Wt 199.5 lb

## 2013-08-17 DIAGNOSIS — B351 Tinea unguium: Secondary | ICD-10-CM

## 2013-08-17 DIAGNOSIS — G4700 Insomnia, unspecified: Secondary | ICD-10-CM

## 2013-08-17 MED ORDER — TERBINAFINE HCL 250 MG PO TABS: 250.0000 mg | ORAL_TABLET | Freq: Every day | ORAL | Status: DC

## 2013-08-17 MED ORDER — TRAZODONE HCL 100 MG PO TABS: 100.0000 mg | ORAL_TABLET | Freq: Every evening | ORAL | Status: DC | PRN

## 2013-08-17 NOTE — Progress Notes (Signed)
Pre visit review using our clinic review tool, if applicable. No additional management support is needed unless otherwise documented below in the visit note. 

## 2013-08-17 NOTE — Progress Notes (Signed)
Max Peters [id number] Phone: [phone] Fax: [phone]  Patient ID: Max Peters MRN: [id number], DOB: Jun 27, 1960, 53 y.o. Date of Encounter: 08/17/2013  Primary Physician:  Owens Loffler, MD   Chief Complaint: Discuss Anxiety Medication and Nail Problem   Subjective:   History of Present Illness:  Max Peters is a 53 y.o. very pleasant male patient who presents with the following:  Insomnia: was not working much at all, told Amy - he got some xanax from a friend. Was not sleeping and took some Xanax, 0.5 mg tablets and would take that and within 30 minutes, would be really well rested.   Took some Elavil prior to this.   Amy gave him Xanax 0.25 mg tabs.   Has a lot of toenail fungus. Does not want to see podiatrists.  Lamisil - for toenails.  Has been using topical OTC meds  Past Medical History, Surgical History, Social History, Family History, Problem List, Medications, and Allergies have been reviewed and updated if relevant.  Review of Systems:  GEN: No acute illnesses, no fevers, chills. GI: No n/v/d, eating normally Pulm: No SOB Interactive and getting along well at home.  Otherwise, ROS is as per the HPI.  Objective:   Physical Examination: BP 130/90  Pulse 78  Temp(Src) 98.3 F (36.8 C) (Oral)  Ht _0  (1.702 m)  Wt 199 lb 8 oz (90.493 kg)  BMI 31.24 kg/m2   GEN: WDWN, NAD, Non-toxic, A & O x 3 HEENT: Atraumatic, Normocephalic. Neck supple. No masses, No LAD. Ears and Nose: No external deformity. CV: RRR, No M/G/R. No JVD. No thrill. No extra heart sounds. PULM: CTA B, no wheezes, crackles, rhonchi. No retractions. No resp. distress. No accessory muscle use. EXTR: No c/c/e NEURO Normal gait.  PSYCH: Normally interactive. Conversant. Not depressed or anxious appearing.  Calm demeanor. Gets a little tearful when talking about his divorce.  Severe onychomycosis on basically every toenail.  Laboratory and Imaging Data: Hepatic  Function Latest Ref Rng 01/05/2013 11/17/2008  Total Protein 6.0 - 8.3 g/dL 6.5 6.9  Albumin 3.5 - 5.2 g/dL 3.6 3.8  AST 0 - 37 U/L 35 38(H)  ALT 0 - 53 U/L 31 34  Alk Phosphatase 39 - 117 U/L 61 56  Total Bilirubin 0.3 - 1.2 mg/dL 0.8 0.9  Bilirubin, Direct 0.0 - 0.3 mg/dL 0.1 0.1   CBC Latest Ref Rng 01/05/2013 11/17/2008  WBC 4.5 - 10.5 K/uL 4.9 5.0  Hemoglobin 13.0 - 17.0 g/dL 15.8 16.7  Hematocrit 39.0 - 52.0 % 46.3 49.8  Platelets 150.0 - 400.0 K/uL 249.0 228.0     Assessment & Plan:   Insomnia  Onychomycosis of toenail  I don't think he is really having true panic attacks, but he is having problems with insomnia for a long time. Elavil did not help so much. I do not favor xanax for sleep, so I wanted him to do a trial of trazodone for the next week or so to see if this will help. If this does not work at all, low dose xanax would be reasonable.  Obvious severe onychomycosis.  Follow-up: No Follow-up on file. Unless noted above, the patient is to follow-up if symptoms worsen. Red flags were reviewed with the patient.  New Prescriptions   TERBINAFINE (LAMISIL) 250 MG TABLET    Take 1 tablet (250 mg total) by mouth daily.   TRAZODONE (DESYREL) 100 MG TABLET    Take 1 tablet (100 mg total) by  mouth at bedtime as needed for sleep.   No orders of the defined types were placed in this encounter.    Signed,  Maud Deed. Sindia Kowalczyk, MD, [place]   Patient's Medications  New Prescriptions   TERBINAFINE (LAMISIL) 250 MG TABLET    Take 1 tablet (250 mg total) by mouth daily.   TRAZODONE (DESYREL) 100 MG TABLET    Take 1 tablet (100 mg total) by mouth at bedtime as needed for sleep.  Previous Medications   ALPRAZOLAM (XANAX) 0.25 MG TABLET    Take 1 tablet (0.25 mg total) by mouth at bedtime as needed for anxiety or sleep.   FLUTICASONE (FLONASE) 50 MCG/ACT NASAL SPRAY    Place 2 sprays into the nose daily.   LISINOPRIL (PRINIVIL,ZESTRIL) 40 MG TABLET    TAKE 1 TABLET BY  MOUTH EVERY DAY   MELATONIN 10 MG TABS    Take 1 tablet by mouth at bedtime.   SILODOSIN (RAPAFLO) 4 MG CAPS CAPSULE    Take 4 mg by mouth daily.   TESTOSTERONE CYPIONATE (DEPO-TESTOSTERONE) 200 MG/ML INJECTION    Inject 100 mg into the muscle once a week.    VIAGRA 100 MG TABLET    TAKE 1 TABLET BY MOUTH ONE HOUR PRIOR TO INTERCOURSE  Modified Medications   No medications on file  Discontinued Medications   No medications on file

## 2013-11-17 ENCOUNTER — Telehealth: Payer: Self-pay | Admitting: *Deleted

## 2013-11-17 NOTE — Telephone Encounter (Signed)
Received fax from [facility] requesting prior authorization for Terbinafine.  Prior Authorization completed on CoverMyMeds.

## 2013-11-18 NOTE — Telephone Encounter (Signed)
Terbinafine has been approved until 11/18/2014.  [id number]

## 2013-12-15 ENCOUNTER — Other Ambulatory Visit: Payer: Self-pay | Admitting: Family Medicine

## 2013-12-15 DIAGNOSIS — Z79899 Other long term (current) drug therapy: Secondary | ICD-10-CM

## 2013-12-15 NOTE — Telephone Encounter (Signed)
Last office visit 08/17/2013.  Ok to refill?

## 2013-12-15 NOTE — Telephone Encounter (Signed)
Ok to refill #30, 0 refills  But, he should have a liver panel done since starting this medication.  Terri, can you help schedule? HFP: v58.69

## 2013-12-16 NOTE — Telephone Encounter (Signed)
Lab appointment scheduled for 12/21/2013 @ 9:50 am for HFP.

## 2013-12-21 ENCOUNTER — Other Ambulatory Visit: Payer: 59

## 2013-12-22 ENCOUNTER — Other Ambulatory Visit (INDEPENDENT_AMBULATORY_CARE_PROVIDER_SITE_OTHER): Payer: 59

## 2013-12-22 DIAGNOSIS — I10 Essential (primary) hypertension: Secondary | ICD-10-CM

## 2013-12-22 DIAGNOSIS — Z79899 Other long term (current) drug therapy: Secondary | ICD-10-CM

## 2013-12-22 LAB — HEPATIC FUNCTION PANEL
ALT: 30 U/L (ref 0–53)
AST: 35 U/L (ref 0–37)
Albumin: 4.1 g/dL (ref 3.5–5.2)
Alkaline Phosphatase: 39 U/L (ref 39–117)
Bilirubin, Direct: 0.1 mg/dL (ref 0.0–0.3)
Total Bilirubin: 0.7 mg/dL (ref 0.2–1.2)
Total Protein: 7.2 g/dL (ref 6.0–8.3)

## 2013-12-23 ENCOUNTER — Encounter: Payer: Self-pay | Admitting: *Deleted

## 2014-01-07 ENCOUNTER — Other Ambulatory Visit: Payer: Self-pay | Admitting: Family Medicine

## 2014-01-08 ENCOUNTER — Other Ambulatory Visit (INDEPENDENT_AMBULATORY_CARE_PROVIDER_SITE_OTHER): Payer: 59

## 2014-01-08 DIAGNOSIS — Z125 Encounter for screening for malignant neoplasm of prostate: Secondary | ICD-10-CM

## 2014-01-08 DIAGNOSIS — Z0000 Encounter for general adult medical examination without abnormal findings: Secondary | ICD-10-CM

## 2014-01-08 DIAGNOSIS — I10 Essential (primary) hypertension: Secondary | ICD-10-CM

## 2014-01-08 LAB — LIPID PANEL
CHOLESTEROL: 104 mg/dL (ref 0–200)
HDL: 44.9 mg/dL (ref >39.00)
LDL Cholesterol: 49 mg/dL (ref 0–99)
NonHDL: 59.1
Total CHOL/HDL Ratio: 2
Triglycerides: 51 mg/dL (ref 0.0–149.0)
VLDL: 10.2 mg/dL (ref 0.0–40.0)

## 2014-01-08 LAB — CBC WITH DIFFERENTIAL/PLATELET
BASOS ABS: 0 10*3/uL (ref 0.0–0.1)
BASOS PCT: 0.5 % (ref 0.0–3.0)
Eosinophils Absolute: 0.2 10*3/uL (ref 0.0–0.7)
Eosinophils Relative: 4.1 % (ref 0.0–5.0)
HCT: 46.6 % (ref 39.0–52.0)
HEMOGLOBIN: 15.5 g/dL (ref 13.0–17.0)
LYMPHS PCT: 30.5 % (ref 12.0–46.0)
Lymphs Abs: 1.3 10*3/uL (ref 0.7–4.0)
MCHC: 33.3 g/dL (ref 30.0–36.0)
MCV: 97.2 fl (ref 78.0–100.0)
Monocytes Absolute: 0.7 10*3/uL (ref 0.1–1.0)
Monocytes Relative: 17.1 % — ABNORMAL HIGH (ref 3.0–12.0)
NEUTROS ABS: 2 10*3/uL (ref 1.4–7.7)
Neutrophils Relative %: 47.8 % (ref 43.0–77.0)
Platelets: 218 10*3/uL (ref 150.0–400.0)
RBC: 4.8 Mil/uL (ref 4.22–5.81)
RDW: 15.1 % (ref 11.5–15.5)
WBC: 4.3 10*3/uL (ref 4.0–10.5)

## 2014-01-08 LAB — BASIC METABOLIC PANEL
BUN: 39 mg/dL — AB (ref 6–23)
CHLORIDE: 102 meq/L (ref 96–112)
CO2: 22 mEq/L (ref 19–32)
CREATININE: 1.8 mg/dL — AB (ref 0.4–1.5)
Calcium: 9.1 mg/dL (ref 8.4–10.5)
GFR: 43.39 mL/min — ABNORMAL LOW (ref >60.00)
Glucose, Bld: 89 mg/dL (ref 70–99)
Potassium: 4.2 mEq/L (ref 3.5–5.1)
Sodium: 137 mEq/L (ref 135–145)

## 2014-01-08 LAB — PSA: PSA: 2.62 ng/mL (ref 0.10–4.00)

## 2014-01-08 LAB — TSH: TSH: 3.32 u[IU]/mL (ref 0.35–4.50)

## 2014-01-11 ENCOUNTER — Encounter: Payer: Self-pay | Admitting: Family Medicine

## 2014-01-11 ENCOUNTER — Ambulatory Visit (INDEPENDENT_AMBULATORY_CARE_PROVIDER_SITE_OTHER): Payer: 59 | Admitting: Family Medicine

## 2014-01-11 VITALS — BP 120/90 | HR 73 | Temp 98.0°F | Ht 66.25 in | Wt 192.8 lb

## 2014-01-11 DIAGNOSIS — N183 Chronic kidney disease, stage 3 unspecified: Secondary | ICD-10-CM

## 2014-01-11 DIAGNOSIS — Z0000 Encounter for general adult medical examination without abnormal findings: Secondary | ICD-10-CM

## 2014-01-11 HISTORY — DX: Chronic kidney disease, stage 3 unspecified: N18.30

## 2014-01-11 MED ORDER — TRAZODONE HCL 100 MG PO TABS: 100.0000 mg | ORAL_TABLET | Freq: Every evening | ORAL | Status: DC | PRN

## 2014-01-11 MED ORDER — AMITRIPTYLINE HCL 10 MG PO TABS: 10.0000 mg | ORAL_TABLET | Freq: Every evening | ORAL | Status: DC | PRN

## 2014-01-11 NOTE — Progress Notes (Signed)
Pre visit review using our clinic review tool, if applicable. No additional management support is needed unless otherwise documented below in the visit note. 

## 2014-01-11 NOTE — Progress Notes (Signed)
Dr. Frederico Hamman T. Lorrayne Ismael, MD, [facility] [place], [address] Phone: [phone] Fax: [phone]  01/11/2014  Patient: Max Peters, MRN: [id number], DOB: [birth year], 53 y.o.  Primary Physician:  Owens Loffler, MD  Chief Complaint: Annual Exam  Subjective:   Max Peters is a 53 y.o. pleasant patient who presents with the following:  Preventative Health Maintenance Visit:  Health Maintenance Summary Reviewed and updated, unless pt declines services.  Tobacco History Reviewed. Alcohol: No concerns, no excessive use Exercise Habits: working out all the time. STD concerns: no risk or activity to increase risk Drug Use: None Encouraged self-testicular check  Feet - on some toenail fungus. On Lamilsil. Doing much better.   Health Maintenance  Topic Date Due  . Tetanus/tdap  01/14/1979  . Influenza Vaccine  10/17/2013  . Colonoscopy  06/02/2023   Immunization History  Administered Date(s) Administered  . Influenza,inj,Quad PF,36+ Mos 01/07/2013  . Influenza-Unspecified 01/08/2014   Patient Active Problem List   Diagnosis Date Noted  . Chronic kidney disease (CKD), stage III (moderate) 01/11/2014  . Insomnia 11/07/2010  . ALLERGIC RHINITIS, CHRONIC 03/23/2010  . HYPERTENSION 11/16/2008   Past Medical History  Diagnosis Date  . Hypertension   . Hypogonadism male   . Allergy     seasonal  . Chronic kidney disease (CKD), stage III (moderate) 01/11/2014   Past Surgical History  Procedure Laterality Date  . Shoulder surgery     History   Social History  . Marital Status: Married    Spouse Name: N/A    Number of Children: N/A  . Years of Education: N/A   Occupational History  . [employer]    Social History Main Topics  . Smoking status: Never Smoker   . Smokeless tobacco: Never Used  . Alcohol Use: Yes     Comment: occassionally  . Drug Use: No  . Sexual Activity:  Not on file   Other Topics Concern  . Not on file   Social History Narrative   Regular exercise--yes   Family History  Problem Relation Age of Onset  . Colon cancer Neg Hx    No Known Allergies  Medication list has been reviewed and updated.   General: Denies fever, chills, sweats. No significant weight loss. Eyes: Denies blurring,significant itching ENT: Denies earache, sore throat, and hoarseness. Cardiovascular: Denies chest pains, palpitations, dyspnea on exertion Respiratory: Denies cough, dyspnea at rest,wheeezing Breast: no concerns about lumps GI: Denies nausea, vomiting, diarrhea, constipation, change in bowel habits, abdominal pain, melena, hematochezia GU: Denies penile discharge, ED, urinary flow / outflow problems. No STD concerns. Musculoskeletal: Denies back pain, joint pain Derm: Denies rash, itching Neuro: Denies  paresthesias, frequent falls, frequent headaches Psych: Denies depression, anxiety Endocrine: Denies cold intolerance, heat intolerance, polydipsia Heme: Denies enlarged lymph nodes Allergy: No hayfever  Objective:   BP 120/90  Pulse 73  Temp(Src) 98 F (36.7 C) (Oral)  Ht 5' 6.25" (1.683 m)  Wt 192 lb 12 oz (87.431 kg)  BMI 30.87 kg/m2 Ideal Body Weight: Weight in (lb) to have BMI = 25: 155.7  No exam data present  GEN: well developed, well nourished, no acute distress Eyes: conjunctiva and lids normal, PERRLA, EOMI ENT: TM clear, nares clear, oral exam WNL Neck: supple, no lymphadenopathy, no thyromegaly, no JVD Pulm: clear to auscultation and percussion, respiratory effort normal CV: regular rate and rhythm, S1-S2, no murmur, rub or gallop, no bruits, peripheral  pulses normal and symmetric, no cyanosis, clubbing, edema or varicosities GI: soft, non-tender; no hepatosplenomegaly, masses; active bowel sounds all quadrants GU: no hernia, testicular mass, penile discharge Lymph: no cervical, axillary or inguinal adenopathy MSK: gait  normal, muscle tone and strength WNL, no joint swelling, effusions, discoloration, crepitus  SKIN: clear, good turgor, color WNL, no rashes, lesions, or ulcerations Neuro: normal mental status, normal strength, sensation, and motion Psych: alert; oriented to person, place and time, normally interactive and not anxious or depressed in appearance. All labs reviewed with patient.  Lipids:    Component Value Date/Time   CHOL 104 01/08/2014 0847   TRIG 51.0 01/08/2014 0847   HDL 44.90 01/08/2014 0847   VLDL 10.2 01/08/2014 0847   CHOLHDL 2 01/08/2014 0847   CBC: CBC Latest Ref Rng 01/08/2014 01/05/2013 11/17/2008  WBC 4.0 - 10.5 K/uL 4.3 4.9 5.0  Hemoglobin 13.0 - 17.0 g/dL 15.5 15.8 16.7  Hematocrit 39.0 - 52.0 % 46.6 46.3 49.8  Platelets 150.0 - 400.0 K/uL 218.0 249.0 161.0    Basic Metabolic Panel:    Component Value Date/Time   NA 137 01/08/2014 0847   K 4.2 01/08/2014 0847   CL 102 01/08/2014 0847   CO2 22 01/08/2014 0847   BUN 39* 01/08/2014 0847   CREATININE 1.8* 01/08/2014 0847   GLUCOSE 89 01/08/2014 0847   CALCIUM 9.1 01/08/2014 0847   Hepatic Function Latest Ref Rng 12/22/2013 01/05/2013 11/17/2008  Total Protein 6.0 - 8.3 g/dL 7.2 6.5 6.9  Albumin 3.5 - 5.2 g/dL 4.1 3.6 3.8  AST 0 - 37 U/L 35 35 38(H)  ALT 0 - 53 U/L 30 31 34  Alk Phosphatase 39 - 117 U/L 39 61 56  Total Bilirubin 0.2 - 1.2 mg/dL 0.7 0.8 0.9  Bilirubin, Direct 0.0 - 0.3 mg/dL 0.1 0.1 0.1    Lab Results  Component Value Date   TSH 3.32 01/08/2014   Lab Results  Component Value Date   PSA 2.62 01/08/2014   PSA 3.48 01/05/2013    Assessment and Plan:   Health care maintenance  Chronic kidney disease (CKD), stage III (moderate)  Health Maintenance Exam: The patient's preventative maintenance and recommended screening tests for an annual wellness exam were reviewed in full today. Brought up to date unless services declined.  Counselled on the importance of diet, exercise, and its role in  overall health and mortality. The patient's FH and SH was reviewed, including their home life, tobacco status, and drug and alcohol status.  Cr has worsened slightly. Has seen renal in the past. Will follow.  O/w he is doing well.   Follow-up: No Follow-up on file. Unless noted, follow-up in 1 year for Health Maintenance Exam.  New Prescriptions   No medications on file   No orders of the defined types were placed in this encounter.    Signed,  Maud Deed. Elizet Kaplan, MD   Patient's Medications  New Prescriptions   No medications on file  Previous Medications   FLUTICASONE (FLONASE) 50 MCG/ACT NASAL SPRAY    Place 2 sprays into the nose daily.   LISINOPRIL (PRINIVIL,ZESTRIL) 40 MG TABLET    TAKE 1 TABLET BY MOUTH EVERY DAY   MELATONIN 10 MG TABS    Take 1 tablet by mouth at bedtime.   SILODOSIN (RAPAFLO) 4 MG CAPS CAPSULE    Take 4 mg by mouth daily.   TERBINAFINE (LAMISIL) 250 MG TABLET    TAKE 1 TABLET BY MOUTH DAILY   TESTOSTERONE CYPIONATE (  DEPO-TESTOSTERONE) 200 MG/ML INJECTION    Inject 100 mg into the muscle once a week.    VIAGRA 100 MG TABLET    TAKE 1 TABLET BY MOUTH ONE HOUR PRIOR TO INTERCOURSE  Modified Medications   Modified Medication Previous Medication   AMITRIPTYLINE (ELAVIL) 10 MG TABLET amitriptyline (ELAVIL) 10 MG tablet      Take 1 tablet (10 mg total) by mouth at bedtime as needed.    Take 10 mg by mouth at bedtime as needed.    TRAZODONE (DESYREL) 100 MG TABLET traZODone (DESYREL) 100 MG tablet      Take 1 tablet (100 mg total) by mouth at bedtime as needed for sleep.    Take 1 tablet (100 mg total) by mouth at bedtime as needed for sleep.  Discontinued Medications   ALPRAZOLAM (XANAX) 0.25 MG TABLET    Take 1 tablet (0.25 mg total) by mouth at bedtime as needed for anxiety or sleep.   AMITRIPTYLINE (ELAVIL) 25 MG TABLET    Take 25 mg by mouth at bedtime.

## 2014-01-20 ENCOUNTER — Other Ambulatory Visit: Payer: Self-pay | Admitting: *Deleted

## 2014-01-20 MED ORDER — TERBINAFINE HCL 250 MG PO TABS: 250.0000 mg | ORAL_TABLET | Freq: Every day | ORAL | Status: DC

## 2014-01-20 NOTE — Telephone Encounter (Signed)
Last office visit 01/11/2014.  Last refilled 12/16/2013 for #30 with no refills.  Ok to refill?

## 2014-02-14 ENCOUNTER — Other Ambulatory Visit: Payer: Self-pay | Admitting: Family Medicine

## 2014-05-31 ENCOUNTER — Other Ambulatory Visit: Payer: Self-pay | Admitting: *Deleted

## 2014-05-31 MED ORDER — TERBINAFINE HCL 250 MG PO TABS: 250.0000 mg | ORAL_TABLET | Freq: Every day | ORAL | Status: DC

## 2014-05-31 NOTE — Telephone Encounter (Signed)
Last office visit 01/11/2014.  Last refilled 01/20/2014 for #30 with 3 refills.  Ok to refill?

## 2014-06-04 ENCOUNTER — Other Ambulatory Visit: Payer: Self-pay | Admitting: Family Medicine

## 2014-07-17 ENCOUNTER — Other Ambulatory Visit: Payer: Self-pay | Admitting: Family Medicine

## 2014-08-13 ENCOUNTER — Other Ambulatory Visit: Payer: Self-pay | Admitting: Family Medicine

## 2014-08-13 NOTE — Telephone Encounter (Signed)
Last office visit 01/11/2014.  Last refilled

## 2014-10-01 ENCOUNTER — Other Ambulatory Visit: Payer: Self-pay | Admitting: Family Medicine

## 2014-10-01 NOTE — Telephone Encounter (Signed)
Last office visit 01/11/2014.  Last refilled 05/31/2014 for #30 with 3 refills.  Ok to refill?

## 2014-11-07 ENCOUNTER — Emergency Department
Admission: EM | Admit: 2014-11-07 | Discharge: 2014-11-07 | Disposition: A | Discharge status: Home / Self Care | Payer: 59 | Attending: Emergency Medicine | Admitting: Emergency Medicine

## 2014-11-07 ENCOUNTER — Encounter: Payer: Self-pay | Admitting: Emergency Medicine

## 2014-11-07 DIAGNOSIS — Y9302 Activity, running: Secondary | ICD-10-CM | POA: Diagnosis not present

## 2014-11-07 DIAGNOSIS — Y998 Other external cause status: Secondary | ICD-10-CM | POA: Insufficient documentation

## 2014-11-07 DIAGNOSIS — W2201XA Walked into wall, initial encounter: Secondary | ICD-10-CM | POA: Diagnosis not present

## 2014-11-07 DIAGNOSIS — S0181XA Laceration without foreign body of other part of head, initial encounter: Secondary | ICD-10-CM

## 2014-11-07 DIAGNOSIS — Z7951 Long term (current) use of inhaled steroids: Secondary | ICD-10-CM | POA: Insufficient documentation

## 2014-11-07 DIAGNOSIS — N183 Chronic kidney disease, stage 3 (moderate): Secondary | ICD-10-CM | POA: Diagnosis not present

## 2014-11-07 DIAGNOSIS — Z79899 Other long term (current) drug therapy: Secondary | ICD-10-CM | POA: Diagnosis not present

## 2014-11-07 DIAGNOSIS — I129 Hypertensive chronic kidney disease with stage 1 through stage 4 chronic kidney disease, or unspecified chronic kidney disease: Secondary | ICD-10-CM | POA: Diagnosis not present

## 2014-11-07 DIAGNOSIS — Y92009 Unspecified place in unspecified non-institutional (private) residence as the place of occurrence of the external cause: Secondary | ICD-10-CM | POA: Diagnosis not present

## 2014-11-07 NOTE — ED Notes (Signed)
Patient taken home by friend.

## 2014-11-07 NOTE — Discharge Instructions (Signed)
1. Dermabond and Steri-Strips should fall off in about a week. 2. Return to the ER for worsening symptoms, persistent vomiting, lethargy or other concerns.  Facial Laceration  A facial laceration is a cut on the face. These injuries can be painful and cause bleeding. Lacerations usually heal quickly, but they need special care to reduce scarring. DIAGNOSIS  Your health care provider will take a medical history, ask for details about how the injury occurred, and examine the wound to determine how deep the cut is. TREATMENT  Some facial lacerations may not require closure. Others may not be able to be closed because of an increased risk of infection. The risk of infection and the chance for successful closure will depend on various factors, including the amount of time since the injury occurred. The wound may be cleaned to help prevent infection. If closure is appropriate, pain medicines may be given if needed. Your health care provider will use stitches (sutures), wound glue (adhesive), or skin adhesive strips to repair the laceration. These tools bring the skin edges together to allow for faster healing and a better cosmetic outcome. If needed, you may also be given a tetanus shot. HOME CARE INSTRUCTIONS  Only take over-the-counter or prescription medicines as directed by your health care provider.  Follow your health care provider's instructions for wound care. These instructions will vary depending on the technique used for closing the wound. For Sutures:  Keep the wound clean and dry.   If you were given a bandage (dressing), you should change it at least once a day. Also change the dressing if it becomes wet or dirty, or as directed by your health care provider.   Wash the wound with soap and water 2 times a day. Rinse the wound off with water to remove all soap. Pat the wound dry with a clean towel.   After cleaning, apply a thin layer of the antibiotic ointment recommended by your  health care provider. This will help prevent infection and keep the dressing from sticking.   You may shower as usual after the first 24 hours. Do not soak the wound in water until the sutures are removed.   Get your sutures removed as directed by your health care provider. With facial lacerations, sutures should usually be taken out after 4-5 days to avoid stitch marks.   Wait a few days after your sutures are removed before applying any makeup. For Skin Adhesive Strips:  Keep the wound clean and dry.   Do not get the skin adhesive strips wet. You may bathe carefully, using caution to keep the wound dry.   If the wound gets wet, pat it dry with a clean towel.   Skin adhesive strips will fall off on their own. You may trim the strips as the wound heals. Do not remove skin adhesive strips that are still stuck to the wound. They will fall off in time.  For Wound Adhesive:  You may briefly wet your wound in the shower or bath. Do not soak or scrub the wound. Do not swim. Avoid periods of heavy sweating until the skin adhesive has fallen off on its own. After showering or bathing, gently pat the wound dry with a clean towel.   Do not apply liquid medicine, cream medicine, ointment medicine, or makeup to your wound while the skin adhesive is in place. This may loosen the film before your wound is healed.   If a dressing is placed over the wound, be careful  not to apply tape directly over the skin adhesive. This may cause the adhesive to be pulled off before the wound is healed.   Avoid prolonged exposure to sunlight or tanning lamps while the skin adhesive is in place.  The skin adhesive will usually remain in place for 5-10 days, then naturally fall off the skin. Do not pick at the adhesive film.  After Healing: Once the wound has healed, cover the wound with sunscreen during the day for 1 full year. This can help minimize scarring. Exposure to ultraviolet light in the first year  will darken the scar. It can take 1-2 years for the scar to lose its redness and to heal completely.  SEEK IMMEDIATE MEDICAL CARE IF:  You have redness, pain, or swelling around the wound.   You see ayellowish-white fluid (pus) coming from the wound.   You have chills or a fever.  MAKE SURE YOU:  Understand these instructions.  Will watch your condition.  Will get help right away if you are not doing well or get worse. Document Released: 04/12/2004 Document Revised: 12/24/2012 Document Reviewed: 10/16/2012 [facility] Patient Information 2015 [place], [place]. This information is not intended to replace advice given to you by your health care provider. Make sure you discuss any questions you have with your health care provider.

## 2014-11-07 NOTE — ED Notes (Signed)
Pt started to have scant amount of blood after steristrips were applied to eye area.  Additional steristrip was applied to bottom laceration by MDM, RN.

## 2014-11-07 NOTE — ED Provider Notes (Signed)
[facility] Emergency Department Provider Note  ____________________________________________  Time seen: Approximately 3:26 AM  I have reviewed the triage vital signs and the nursing notes.   HISTORY  Chief Complaint Facial Laceration    HPI Max Peters is a 54 y.o. male who presents to the ED from home with a chief complaint of facial laceration. Patient states he was walking briskly through his house with the lights off and ran into the corner of a wall. Denies loss of consciousness. Denies striking ground or neck pain. Denies dizziness, nausea, vomiting, vision changes. Reports tetanus is up-to-date.   Past Medical History  Diagnosis Date  . Hypertension   . Hypogonadism male   . Allergy     seasonal  . Chronic kidney disease (CKD), stage III (moderate) 01/11/2014    Patient Active Problem List   Diagnosis Date Noted  . Chronic kidney disease (CKD), stage III (moderate) 01/11/2014  . Insomnia 11/07/2010  . ALLERGIC RHINITIS, CHRONIC 03/23/2010  . HYPERTENSION 11/16/2008    Past Surgical History  Procedure Laterality Date  . Shoulder surgery      Current Outpatient Rx  Name  Route  Sig  Dispense  Refill  . amitriptyline (ELAVIL) 10 MG tablet   Oral   Take 1 tablet (10 mg total) by mouth at bedtime as needed.   30 tablet   11   . fluticasone (FLONASE) 50 MCG/ACT nasal spray      USE 2 SPRAYS IN EACH NOSTRIL DAILY   16 g   4   . lisinopril (PRINIVIL,ZESTRIL) 40 MG tablet      TAKE 1 TABLET BY MOUTH EVERY DAY   30 tablet   5   . Melatonin 10 MG TABS   Oral   Take 1 tablet by mouth at bedtime.         . silodosin (RAPAFLO) 4 MG CAPS capsule   Oral   Take 4 mg by mouth daily.         Marland Kitchen terbinafine (LAMISIL) 250 MG tablet      TAKE 1 TABLET (250 MG TOTAL) BY MOUTH DAILY.   30 tablet   3   . testosterone cypionate (DEPO-TESTOSTERONE) 200 MG/ML injection   Intramuscular   Inject 100 mg into the muscle once a week.         . traZODone (DESYREL) 100 MG tablet      TAKE 1 TABLET BY MOUTH AT BEDTIME AS NEEDED FOR SLEEP   30 tablet   2   . VIAGRA 100 MG tablet      TAKE 1 TABLET BY MOUTH ONE HOUR PRIOR TO INTERCOURSE   4 tablet   6     Allergies Review of patient's allergies indicates no known allergies.  Family History  Problem Relation Age of Onset  . Colon cancer Neg Hx     Social History Social History  Substance Use Topics  . Smoking status: Never Smoker   . Smokeless tobacco: Never Used  . Alcohol Use: Yes     Comment: occassionally    Review of Systems Constitutional: No fever/chills Eyes: No visual changes. ENT: Positive for facial laceration. No sore throat. Cardiovascular: Denies chest pain. Respiratory: Denies shortness of breath. Gastrointestinal: No abdominal pain.  No nausea, no vomiting.  No diarrhea.  No constipation. Genitourinary: Negative for dysuria. Musculoskeletal: Negative for back pain. Skin: Negative for rash. Neurological: Negative for headaches, focal weakness or numbness.  10-point ROS otherwise negative.  ____________________________________________  PHYSICAL EXAM:  VITAL SIGNS: ED Triage Vitals  Enc Vitals Group     BP 11/07/14 0318 166/94 mmHg     Pulse Rate 11/07/14 0318 95     Resp 11/07/14 0318 20     Temp 11/07/14 0318 97.7 F (36.5 C)     Temp Source 11/07/14 0318 Oral     SpO2 11/07/14 0318 96 %     Weight 11/07/14 0318 182 lb (82.555 kg)     Height 11/07/14 0318  (1.676 m)     Head Cir --      Peak Flow --      Pain Score --      Pain Loc --      Pain Edu? --      Excl. in GC? --     Constitutional: Alert and oriented. Well appearing and in no acute distress. Eyes: Conjunctivae are normal. PERRL. EOMI. Head: Approximately 1 cm linear, superficial, nonbleeding, well approximated laceration above right eyebrow. There is no ecchymosis or hematoma. Nose: No congestion/rhinnorhea. Mouth/Throat: Mucous membranes are  moist.  Oropharynx non-erythematous. Neck: No stridor. No cervical spine tenderness to palpation. Cardiovascular: Normal rate, regular rhythm. Grossly normal heart sounds.  Good peripheral circulation. Respiratory: Normal respiratory effort.  No retractions. Lungs CTAB. Gastrointestinal: Soft and nontender. No distention. No abdominal bruits. No CVA tenderness. Musculoskeletal: No lower extremity tenderness nor edema.  No joint effusions. Neurologic:  Normal speech and language. No gross focal neurologic deficits are appreciated. No gait instability. Skin:  Skin is warm, dry and intact. No rash noted. Psychiatric: Mood and affect are normal. Speech and behavior are normal.  ____________________________________________   LABS (all labs ordered are listed, but only abnormal results are displayed)  Labs Reviewed - No data to display ____________________________________________  EKG  None ____________________________________________  RADIOLOGY  None ____________________________________________   PROCEDURES  Procedure(s) performed:  LACERATION REPAIR Performed by: Irean Hong Authorized by: Irean Hong Consent: Verbal consent obtained. Risks and benefits: risks, benefits and alternatives were discussed Consent given by: patient Patient identity confirmed: provided demographic data Prepped and Draped in normal sterile fashion Wound explored  Laceration Location: Above right eyebrow   Laceration Length: 1cm  No Foreign Bodies seen or palpated  Anesthesia: none  Amount of cleaning: standard  Skin closure: Dermabond and Steri-Strips  Technique: standard  Patient tolerance: Patient tolerated the procedure well with no immediate complications.  Critical Care performed: No  ____________________________________________   INITIAL IMPRESSION / ASSESSMENT AND PLAN / ED COURSE  Pertinent labs & imaging results that were available during my care of the patient were  reviewed by me and considered in my medical decision making (see chart for details).  54 year old male who presents with facial laceration. No focal neurological deficits noted. Superficial laceration repaired with Dermabond and Steri-Strips. Strict return precautions given. Patient and spouse verbalize understanding and agree with plan of care. ____________________________________________   FINAL CLINICAL IMPRESSION(S) / ED DIAGNOSES  Final diagnoses:  Facial laceration, initial encounter      Irean Hong, MD 11/07/14 [phone]

## 2014-11-07 NOTE — ED Notes (Signed)
Pt. States walking in friends [place] with lights off and pt. Ran into corner wall.  Pt. Has an one inch laceration above rt. Eye.  Bleeding controlled at this time.  Pt. Denies LOC.

## 2014-11-07 NOTE — ED Notes (Signed)
Patient reports walking into the corner of wall.  Laceration to forehead.  Denies loss of consciousness.

## 2014-11-13 ENCOUNTER — Other Ambulatory Visit: Payer: Self-pay | Admitting: Family Medicine

## 2014-11-13 NOTE — Telephone Encounter (Signed)
Last office visit 01/11/2014.  Last refilled 08/13/2014 for #30 with 2 refills. Ok to refill?

## 2014-12-06 ENCOUNTER — Telehealth: Payer: Self-pay | Admitting: *Deleted

## 2014-12-06 NOTE — Telephone Encounter (Signed)
Received fax from pharmacy requesting PA for terbinafine 250 mg.  PA completed on CoverMyMeds.  Awaiting decision.

## 2014-12-31 NOTE — Telephone Encounter (Signed)
PA denied.  Insurance plan only allows #90 tablets per year.

## 2015-01-30 ENCOUNTER — Telehealth: Payer: Self-pay | Admitting: Family Medicine

## 2015-01-30 NOTE — Telephone Encounter (Signed)
Please call and schedule CPE with fasting labs prior for Dr. Copland.  

## 2015-01-31 ENCOUNTER — Other Ambulatory Visit: Payer: Self-pay | Admitting: Family Medicine

## 2015-01-31 NOTE — Telephone Encounter (Signed)
Labs 12/7 cpx 12/12 Pt aware Please close

## 2015-02-04 ENCOUNTER — Other Ambulatory Visit: Payer: Self-pay | Admitting: *Deleted

## 2015-02-04 MED ORDER — AMITRIPTYLINE HCL 10 MG PO TABS: 10.0000 mg | ORAL_TABLET | Freq: Every evening | ORAL | Status: DC | PRN

## 2015-02-07 MED ORDER — AMITRIPTYLINE HCL 10 MG PO TABS: 10.0000 mg | ORAL_TABLET | Freq: Every evening | ORAL | Status: DC | PRN

## 2015-02-07 NOTE — Addendum Note (Signed)
Addended by: Damita LackLORING, Leianna Barga S on: 02/07/2015 09:53 AM   Modules accepted: Orders

## 2015-02-07 NOTE — Telephone Encounter (Signed)
Original Rx refill sent to wrong pharmacy.

## 2015-02-16 ENCOUNTER — Other Ambulatory Visit: Payer: Self-pay | Admitting: Family Medicine

## 2015-02-17 NOTE — Telephone Encounter (Signed)
Last office visit 01/11/2014.  Last refilled 10/01/2014 for #30 with 3 refills. CPE scheduled 02/28/2015.  Refill?

## 2015-02-22 ENCOUNTER — Other Ambulatory Visit: Payer: Self-pay | Admitting: Family Medicine

## 2015-02-22 ENCOUNTER — Telehealth: Payer: Self-pay | Admitting: Family Medicine

## 2015-02-22 DIAGNOSIS — I10 Essential (primary) hypertension: Secondary | ICD-10-CM

## 2015-02-22 DIAGNOSIS — Z125 Encounter for screening for malignant neoplasm of prostate: Secondary | ICD-10-CM

## 2015-02-22 DIAGNOSIS — N183 Chronic kidney disease, stage 3 unspecified: Secondary | ICD-10-CM

## 2015-02-22 NOTE — Telephone Encounter (Signed)
-----   Message from [name] sent at 02/17/2015  3:25 PM EST ----- Regarding: Lab orders for Wednesday, 12.7.16 Patient is scheduled for CPX labs, please order future labs, Thanks , Camelia Engerri

## 2015-02-23 ENCOUNTER — Other Ambulatory Visit (INDEPENDENT_AMBULATORY_CARE_PROVIDER_SITE_OTHER): Payer: 59

## 2015-02-23 DIAGNOSIS — Z125 Encounter for screening for malignant neoplasm of prostate: Secondary | ICD-10-CM | POA: Diagnosis not present

## 2015-02-23 DIAGNOSIS — I10 Essential (primary) hypertension: Secondary | ICD-10-CM | POA: Diagnosis not present

## 2015-02-23 LAB — LIPID PANEL
CHOL/HDL RATIO: 2
Cholesterol: 105 mg/dL (ref 0–200)
HDL: 49.8 mg/dL (ref >39.00)
LDL CALC: 38 mg/dL (ref 0–99)
NonHDL: 55.4
TRIGLYCERIDES: 85 mg/dL (ref 0.0–149.0)
VLDL: 17 mg/dL (ref 0.0–40.0)

## 2015-02-23 LAB — CBC WITH DIFFERENTIAL/PLATELET
Basophils Absolute: 0 10*3/uL (ref 0.0–0.1)
Basophils Relative: 0.4 % (ref 0.0–3.0)
Eosinophils Absolute: 0.2 10*3/uL (ref 0.0–0.7)
Eosinophils Relative: 5.3 % — ABNORMAL HIGH (ref 0.0–5.0)
HCT: 48.6 % (ref 39.0–52.0)
HEMOGLOBIN: 16.3 g/dL (ref 13.0–17.0)
LYMPHS ABS: 1.4 10*3/uL (ref 0.7–4.0)
LYMPHS PCT: 31.2 % (ref 12.0–46.0)
MCHC: 33.6 g/dL (ref 30.0–36.0)
MCV: 95.4 fl (ref 78.0–100.0)
MONO ABS: 0.6 10*3/uL (ref 0.1–1.0)
Monocytes Relative: 13.8 % — ABNORMAL HIGH (ref 3.0–12.0)
NEUTROS ABS: 2.3 10*3/uL (ref 1.4–7.7)
NEUTROS PCT: 49.3 % (ref 43.0–77.0)
Platelets: 245 10*3/uL (ref 150.0–400.0)
RBC: 5.09 Mil/uL (ref 4.22–5.81)
RDW: 14.4 % (ref 11.5–15.5)
WBC: 4.6 10*3/uL (ref 4.0–10.5)

## 2015-02-23 LAB — COMPREHENSIVE METABOLIC PANEL
ALT: 27 U/L (ref 0–53)
AST: 34 U/L (ref 0–37)
Albumin: 4.2 g/dL (ref 3.5–5.2)
Alkaline Phosphatase: 49 U/L (ref 39–117)
BUN: 34 mg/dL — AB (ref 6–23)
CHLORIDE: 100 meq/L (ref 96–112)
CO2: 32 meq/L (ref 19–32)
CREATININE: 1.57 mg/dL — AB (ref 0.40–1.50)
Calcium: 9.4 mg/dL (ref 8.4–10.5)
GFR: 48.98 mL/min — ABNORMAL LOW (ref >60.00)
GLUCOSE: 91 mg/dL (ref 70–99)
Potassium: 4.7 mEq/L (ref 3.5–5.1)
SODIUM: 138 meq/L (ref 135–145)
Total Bilirubin: 0.4 mg/dL (ref 0.2–1.2)
Total Protein: 6.6 g/dL (ref 6.0–8.3)

## 2015-02-23 LAB — PSA: PSA: 3.2 ng/mL (ref 0.10–4.00)

## 2015-02-28 ENCOUNTER — Encounter: Payer: 59 | Admitting: Family Medicine

## 2015-03-02 ENCOUNTER — Other Ambulatory Visit: Payer: Self-pay | Admitting: Family Medicine

## 2015-03-07 ENCOUNTER — Other Ambulatory Visit: Payer: Self-pay | Admitting: *Deleted

## 2015-03-07 MED ORDER — TRAZODONE HCL 100 MG PO TABS: 100.0000 mg | ORAL_TABLET | Freq: Every evening | ORAL | Status: DC | PRN

## 2015-03-16 ENCOUNTER — Ambulatory Visit (INDEPENDENT_AMBULATORY_CARE_PROVIDER_SITE_OTHER): Payer: 59 | Admitting: Family Medicine

## 2015-03-16 ENCOUNTER — Encounter (INDEPENDENT_AMBULATORY_CARE_PROVIDER_SITE_OTHER): Payer: Self-pay

## 2015-03-16 ENCOUNTER — Encounter: Payer: Self-pay | Admitting: Family Medicine

## 2015-03-16 VITALS — BP 130/98 | HR 77 | Temp 98.2°F | Ht 66.25 in | Wt 193.5 lb

## 2015-03-16 DIAGNOSIS — Z0000 Encounter for general adult medical examination without abnormal findings: Secondary | ICD-10-CM | POA: Diagnosis not present

## 2015-03-16 DIAGNOSIS — Z23 Encounter for immunization: Secondary | ICD-10-CM | POA: Diagnosis not present

## 2015-03-16 MED ORDER — SILDENAFIL CITRATE 20 MG PO TABS: ORAL_TABLET | ORAL | Status: DC

## 2015-03-16 MED ORDER — AMLODIPINE BESYLATE 5 MG PO TABS: 5.0000 mg | ORAL_TABLET | Freq: Every day | ORAL | Status: DC

## 2015-03-16 MED ORDER — TESTOSTERONE CYPIONATE 200 MG/ML IM SOLN: 100.0000 mg | INTRAMUSCULAR | Status: DC

## 2015-03-16 MED ORDER — SILODOSIN 8 MG PO CAPS: 8.0000 mg | ORAL_CAPSULE | Freq: Every day | ORAL | Status: DC

## 2015-03-16 MED ORDER — LISINOPRIL 40 MG PO TABS: 40.0000 mg | ORAL_TABLET | Freq: Every day | ORAL | Status: DC

## 2015-03-16 NOTE — Patient Instructions (Signed)
Follow-up in 3 months for blood draw on a Friday. (Testosterone and Free testosterone)

## 2015-03-16 NOTE — Progress Notes (Signed)
Pre visit review using our clinic review tool, if applicable. No additional management support is needed unless otherwise documented below in the visit note. 

## 2015-03-16 NOTE — Progress Notes (Signed)
 Dr. Spencer T. Copland, MD, [facility] [address] [place], [address] Phone: [phone] Fax: [phone]  03/16/2015  Patient: Max Peters, MRN: [id number], DOB: 02/09/1961, 54 y.o.  Primary Physician:  Spencer Copland, MD   Chief Complaint  Patient presents with  . Annual Exam   Subjective:   Max Peters is a 54 y.o. pleasant patient who presents with the following:  Preventative Health Maintenance Visit:  Health Maintenance Summary Reviewed and updated, unless pt declines services.  Tobacco History Reviewed. Alcohol: No concerns, no excessive use Exercise Habits: Working out almost every day, rec at least 30 mins 5 times a week STD concerns: no risk or activity to increase risk Drug Use: None Encouraged self-testicular check  Feeling pretty good  Still having some edema underneath his socks. Below his socks it is normal. Does have some varicose veins.  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 Question about what is causing swelling.   BP Readings from Last 3 Encounters:  03/16/15 130/98  11/07/14 166/94  01/11/14 120/90    145/95  Test - having trouble communicating with urologist. Questions regarding test levels that i try to answer. Questions if i can take over the management of this issue.   Health Maintenance  Topic Date Due  . Hepatitis C Screening  10/11/1959  . HIV Screening  01/14/1975  . INFLUENZA VACCINE  10/18/2015  . COLONOSCOPY  06/02/2023  . TETANUS/TDAP  03/15/2025   Immunization History  Administered Date(s) Administered  . Influenza,inj,Quad PF,36+ Mos 01/07/2013, 03/16/2015  . Influenza-Unspecified 01/08/2014  . Tdap 03/16/2015   Patient Active Problem List   Diagnosis Date Noted  . Chronic kidney disease (CKD), stage III (moderate) 01/11/2014  . Insomnia 11/07/2010  . ALLERGIC RHINITIS, CHRONIC 03/23/2010  . Essential hypertension, benign 11/16/2008   Past Medical History    Diagnosis Date  . Hypertension   . Hypogonadism male   . Allergy     seasonal  . Chronic kidney disease (CKD), stage III (moderate) 01/11/2014   Past Surgical History  Procedure Laterality Date  . Shoulder surgery     Social History   Social History  . Marital Status: Married    Spouse Name: N/A  . Number of Children: N/A  . Years of Education: N/A   Occupational History  . [employer]    Social History Main Topics  . Smoking status: Never Smoker   . Smokeless tobacco: Never Used  . Alcohol Use: Yes     Comment: occassionally  . Drug Use: No  . Sexual Activity: Not on file   Other Topics Concern  . Not on file   Social History Narrative   Regular exercise--yes   Family History  Problem Relation Age of Onset  . Colon cancer Neg Hx    No Known Allergies  Medication list has been reviewed and updated.   General: Denies fever, chills, sweats. No significant weight loss. Eyes: Denies blurring,significant itching ENT: Denies earache, sore throat, and hoarseness. Cardiovascular: Denies chest pains, palpitations, dyspnea on exertion Respiratory: Denies cough, dyspnea at rest,wheeezing Breast: no concerns about lumps GI: Denies nausea, vomiting, diarrhea, constipation, change in bowel habits, abdominal pain, melena, hematochezia GU: Denies penile discharge, ED, urinary flow / outflow problems. No STD concerns. Musculoskeletal: Denies back pain, joint pain Derm: Denies rash, itching Neuro: Denies  paresthesias, frequent falls, frequent headaches Psych: Denies depression, anxiety Endocrine: Denies cold intolerance, heat intolerance, polydipsia Heme: Denies enlarged lymph   nodes Allergy: No hayfever  Objective:   BP 130/98 mmHg  Pulse 77  Temp(Src) 98.2 F (36.8 C) (Oral)  Ht 5' 6.25" (1.683 m)  Wt 193 lb 8 oz (87.771 kg)  BMI 30.99 kg/m2 Ideal Body Weight: Weight in (lb) to have BMI = 25: 155.7  No exam data present  GEN: well  developed, well nourished, no acute distress Eyes: conjunctiva and lids normal, PERRLA, EOMI ENT: TM clear, nares clear, oral exam WNL Neck: supple, no lymphadenopathy, no thyromegaly, no JVD Pulm: clear to auscultation and percussion, respiratory effort normal CV: regular rate and rhythm, S1-S2, no murmur, rub or gallop, no bruits, peripheral pulses normal and symmetric. GI: soft, non-tender; no hepatosplenomegaly, masses; active bowel sounds all quadrants GU: defer Lymph: no cervical, axillary or inguinal adenopathy MSK: gait normal, muscle tone and strength WNL, no joint swelling, effusions, discoloration, crepitus  SKIN: clear, good turgor, color WNL, no rashes, lesions, or ulcerations Neuro: normal mental status, normal strength, sensation, and motion Psych: alert; oriented to person, place and time, normally interactive and not anxious or depressed in appearance.  Tr le edema  All labs reviewed with patient.  Lipids:    Component Value Date/Time   CHOL 105 02/23/2015 0837   TRIG 85.0 02/23/2015 0837   HDL 49.80 02/23/2015 0837   VLDL 17.0 02/23/2015 0837   CHOLHDL 2 02/23/2015 0837   CBC: CBC Latest Ref Rng 02/23/2015 01/08/2014 01/05/2013  WBC 4.0 - 10.5 K/uL 4.6 4.3 4.9  Hemoglobin 13.0 - 17.0 g/dL 16.3 15.5 15.8  Hematocrit 39.0 - 52.0 % 48.6 46.6 46.3  Platelets 150.0 - 400.0 K/uL 245.0 218.0 249.0    Basic Metabolic Panel:    Component Value Date/Time   NA 138 02/23/2015 0837   K 4.7 02/23/2015 0837   CL 100 02/23/2015 0837   CO2 32 02/23/2015 0837   BUN 34* 02/23/2015 0837   CREATININE 1.57* 02/23/2015 0837   GLUCOSE 91 02/23/2015 0837   CALCIUM 9.4 02/23/2015 0837   Hepatic Function Latest Ref Rng 02/23/2015 12/22/2013 01/05/2013  Total Protein 6.0 - 8.3 g/dL 6.6 7.2 6.5  Albumin 3.5 - 5.2 g/dL 4.2 4.1 3.6  AST 0 - 37 U/L 34 35 35  ALT 0 - 53 U/L 27 30 31  Alk Phosphatase 39 - 117 U/L 49 39 61  Total Bilirubin 0.2 - 1.2 mg/dL 0.4 0.7 0.8  Bilirubin,  Direct 0.0 - 0.3 mg/dL - 0.1 0.1    Lab Results  Component Value Date   TSH 3.32 01/08/2014   Lab Results  Component Value Date   PSA 3.20 02/23/2015   PSA 2.62 01/08/2014   PSA 3.48 01/05/2013    Assessment and Plan:   Health care maintenance  Need for prophylactic vaccination and inoculation against influenza - Plan: Flu Vaccine QUAD 36+ mos IM  Need for prophylactic vaccination with combined diphtheria-tetanus-pertussis (DTP) vaccine - Plan: Tdap vaccine greater than or equal to 7yo IM  Health Maintenance Exam: The patient's preventative maintenance and recommended screening tests for an annual wellness exam were reviewed in full today. Brought up to date unless services declined.  Counselled on the importance of diet, exercise, and its role in overall health and mortality. The patient's FH and SH was reviewed, including their home life, tobacco status, and drug and alcohol status.  BP up, start norvasc 5 Repeat total and free AM test level in 3 months. I have agree to take over testosterone management.   Follow-up: No Follow-up on file.   Unless noted, follow-up in 1 year for Health Maintenance Exam.  New Prescriptions   AMLODIPINE (NORVASC) 5 MG TABLET    Take 1 tablet (5 mg total) by mouth daily.   SILDENAFIL (REVATIO) 20 MG TABLET    Generic Revatio / Sildanefil 20 mg. 2 - 5 tabs 30 mins prior to intercourse. #90, 11 refills.   Modified Medications   Modified Medication Previous Medication   LISINOPRIL (PRINIVIL,ZESTRIL) 40 MG TABLET lisinopril (PRINIVIL,ZESTRIL) 40 MG tablet      Take 1 tablet (40 mg total) by mouth daily.    TAKE 1 TABLET BY MOUTH EVERY DAY   SILODOSIN (RAPAFLO) 8 MG CAPS CAPSULE silodosin (RAPAFLO) 8 MG CAPS capsule      Take 1 capsule (8 mg total) by mouth daily with breakfast.    Take 8 mg by mouth daily with breakfast.   TESTOSTERONE CYPIONATE (DEPO-TESTOSTERONE) 200 MG/ML INJECTION testosterone cypionate (DEPO-TESTOSTERONE) 200 MG/ML injection        Inject 0.5 mLs (100 mg total) into the muscle once a week.    Inject 100 mg into the muscle once a week.    Orders Placed This Encounter  Procedures  . Flu Vaccine QUAD 36+ mos IM  . Tdap vaccine greater than or equal to 7yo IM    Signed,  Spencer T. Copland, MD   Patient's Medications  New Prescriptions   AMLODIPINE (NORVASC) 5 MG TABLET    Take 1 tablet (5 mg total) by mouth daily.   SILDENAFIL (REVATIO) 20 MG TABLET    Generic Revatio / Sildanefil 20 mg. 2 - 5 tabs 30 mins prior to intercourse. #90, 11 refills.  Previous Medications   AMITRIPTYLINE (ELAVIL) 10 MG TABLET    TAKE 1 TABLET (10 MG TOTAL) BY MOUTH AT BEDTIME AS NEEDED.   FLUTICASONE (FLONASE) 50 MCG/ACT NASAL SPRAY    USE 2 SPRAYS IN EACH NOSTRIL DAILY   MELATONIN 10 MG TABS    Take 1 tablet by mouth at bedtime.   TERBINAFINE (LAMISIL) 250 MG TABLET    TAKE 1 TABLET (250 MG TOTAL) BY MOUTH DAILY.   TRAZODONE (DESYREL) 100 MG TABLET    Take 1 tablet (100 mg total) by mouth at bedtime as needed. for sleep  Modified Medications   Modified Medication Previous Medication   LISINOPRIL (PRINIVIL,ZESTRIL) 40 MG TABLET lisinopril (PRINIVIL,ZESTRIL) 40 MG tablet      Take 1 tablet (40 mg total) by mouth daily.    TAKE 1 TABLET BY MOUTH EVERY DAY   SILODOSIN (RAPAFLO) 8 MG CAPS CAPSULE silodosin (RAPAFLO) 8 MG CAPS capsule      Take 1 capsule (8 mg total) by mouth daily with breakfast.    Take 8 mg by mouth daily with breakfast.   TESTOSTERONE CYPIONATE (DEPO-TESTOSTERONE) 200 MG/ML INJECTION testosterone cypionate (DEPO-TESTOSTERONE) 200 MG/ML injection      Inject 0.5 mLs (100 mg total) into the muscle once a week.    Inject 100 mg into the muscle once a week.   Discontinued Medications   SILODOSIN (RAPAFLO) 4 MG CAPS CAPSULE    Take 4 mg by mouth daily.   VIAGRA 100 MG TABLET    TAKE 1 TABLET BY MOUTH ONE HOUR PRIOR TO INTERCOURSE      

## 2015-03-28 ENCOUNTER — Other Ambulatory Visit: Payer: Self-pay | Admitting: Family Medicine

## 2015-04-05 ENCOUNTER — Other Ambulatory Visit: Payer: Self-pay | Admitting: *Deleted

## 2015-04-05 MED ORDER — TRAZODONE HCL 100 MG PO TABS: 100.0000 mg | ORAL_TABLET | Freq: Every evening | ORAL | Status: DC | PRN

## 2015-06-03 ENCOUNTER — Other Ambulatory Visit: Payer: Self-pay | Admitting: *Deleted

## 2015-06-03 MED ORDER — TERBINAFINE HCL 250 MG PO TABS: ORAL_TABLET | ORAL | Status: DC

## 2015-06-08 ENCOUNTER — Ambulatory Visit (INDEPENDENT_AMBULATORY_CARE_PROVIDER_SITE_OTHER): Payer: 59 | Admitting: Family Medicine

## 2015-06-08 ENCOUNTER — Ambulatory Visit
Admission: RE | Admit: 2015-06-08 | Discharge: 2015-06-08 | Disposition: A | Discharge status: Home / Self Care | Payer: 59 | Source: Ambulatory Visit | Attending: Family Medicine | Admitting: Family Medicine

## 2015-06-08 ENCOUNTER — Encounter: Payer: Self-pay | Admitting: Family Medicine

## 2015-06-08 VITALS — BP 158/102 | HR 79 | Temp 97.7°F | Ht 66.75 in | Wt 198.2 lb

## 2015-06-08 DIAGNOSIS — R600 Localized edema: Secondary | ICD-10-CM | POA: Insufficient documentation

## 2015-06-08 DIAGNOSIS — I10 Essential (primary) hypertension: Secondary | ICD-10-CM | POA: Diagnosis not present

## 2015-06-08 MED ORDER — CARVEDILOL 3.125 MG PO TABS: 3.1250 mg | ORAL_TABLET | Freq: Two times a day (BID) | ORAL | Status: DC

## 2015-06-08 NOTE — Progress Notes (Signed)
Subjective:  Patient ID: Max Peters, male    DOB: 1960-06-17  Age: 55 y.o. MRN: [id number]  CC: L leg swelling  HPI:  55 year old male with HTN and CKD presents with the above complaint.  Left leg swelling  Patient states that he's had significant swelling in his left leg the past 3 weeks.  He states that 3 weeks ago he was at the gym and was doing hamstring curls when he heard/felt a pop. He has some pain in his hamstring.  He subsequently developed bruising and significant swelling of his thigh swelling is now progressed to the remainder of his leg.  He states that it improves with elevation/rest.  No other known relieving factors.  No known exacerbating factors.  No associated shortness of breath, chest pain.  Additionally, patient is also concerned about his lower extremity edema she has a baseline bilaterally. He was recently started on amlodipine  HTN  Not at goal.  Compliant with lisinopril and amlodipine.  Will address today.  Social Hx   Social History   Social History  . Marital Status: Married    Spouse Name: N/A  . Number of Children: N/A  . Years of Education: N/A   Occupational History  . [employer]commercial    Social History Main Topics  . Smoking status: Never Smoker   . Smokeless tobacco: Never Used  . Alcohol Use: Yes     Comment: occassionally  . Drug Use: No  . Sexual Activity: Not Asked   Other Topics Concern  . None   Social History Narrative   Regular exercise--yes   Review of Systems  Respiratory: Negative for shortness of breath.   Cardiovascular: Positive for leg swelling. Negative for chest pain.    Objective:  BP 158/102 mmHg  Pulse 79  Temp(Src) 97.7 F (36.5 C) (Oral)  Ht 5' 6.75" (1.695 m)  Wt 198 lb 3.2 oz (89.903 kg)  BMI 31.29 kg/m2  SpO2 99%  BP/Weight 06/08/2015 03/16/2015 11/07/2014  Systolic BP 158 130 166  Diastolic BP 102 98 94  Wt. (Lbs) 198.2 193.5 182  BMI 31.29 30.99 29.39     Physical Exam  Constitutional: He is oriented to person, place, and time. He appears well-developed. No distress.  Cardiovascular: Normal rate and regular rhythm.   3+ L lower extremity edema extending to just below the knee. Trace to 1+ R lower extremity edema.   Pulmonary/Chest: Effort normal. He has wheezes.  Neurological: He is alert and oriented to person, place, and time.  Psychiatric: He has a normal mood and affect.  Vitals reviewed.  Lab Results  Component Value Date   WBC 4.6 02/23/2015   HGB 16.3 02/23/2015   HCT 48.6 02/23/2015   PLT 245.0 02/23/2015   GLUCOSE 91 02/23/2015   CHOL 105 02/23/2015   TRIG 85.0 02/23/2015   HDL 49.80 02/23/2015   LDLCALC 38 02/23/2015   ALT 27 02/23/2015   AST 34 02/23/2015   NA 138 02/23/2015   K 4.7 02/23/2015   CL 100 02/23/2015   CREATININE 1.57* 02/23/2015   BUN 34* 02/23/2015   CO2 32 02/23/2015   TSH 3.32 01/08/2014   PSA 3.20 02/23/2015    Assessment & Plan:   Problem List Items Addressed This Visit    Essential hypertension, benign    Established problem, worsening. Uncontrolled. Given patient's concern about baseline edema, discontinuing amlodipine. Patient to continue lisinopril. Adding Coreg.  Follow up with PCP.  Relevant Medications   carvedilol (COREG) 3.125 MG tablet   Leg edema, left - Primary    New problem. Given severe pitting edema, testosterone use and acuity, US was obtained today to rule out DVT. US was negative.  Advise compression/elevation.      Relevant Orders   US Venous Img Lower Unilateral Left (Completed)      Meds ordered this encounter  Medications  . carvedilol (COREG) 3.125 MG tablet    Sig: Take 1 tablet (3.125 mg total) by mouth 2 (two) times daily with a meal.    Dispense:  60 tablet    Refill:  3   Follow-up: PRN  Everlene OtherJayce Prachi Oftedahl DO [facility] [place]

## 2015-06-08 NOTE — Assessment & Plan Note (Signed)
Established problem, worsening. Uncontrolled. Given patient's concern about baseline edema, discontinuing amlodipine. Patient to continue lisinopril. Adding Coreg.  Follow up with PCP.

## 2015-06-08 NOTE — Assessment & Plan Note (Signed)
New problem. Given severe pitting edema, testosterone use and acuity, US was obtained today to rule out DVT. US was negative.  Advise compression/elevation.

## 2015-06-08 NOTE — Patient Instructions (Signed)
It was nice to see you today.  Stop the amlodipine.  Start the Coreg.  Follow up closely with Dr. Patsy Lageropland.  Take care  Dr. Adriana Simasook

## 2015-06-09 ENCOUNTER — Telehealth: Payer: Self-pay | Admitting: Family Medicine

## 2015-06-09 ENCOUNTER — Other Ambulatory Visit: Payer: Self-pay | Admitting: Family Medicine

## 2015-06-09 ENCOUNTER — Ambulatory Visit: Payer: 59 | Admitting: Family Medicine

## 2015-06-09 NOTE — Telephone Encounter (Signed)
The patient called wanting results of his ultrasound and where he needs to go from this point.

## 2015-06-09 NOTE — Telephone Encounter (Signed)
He should be able to get these at a medical supply store without a prescription. If he needs one he can contact his physician.

## 2015-06-09 NOTE — Telephone Encounter (Signed)
Notified pt of test results, pt verbalized understanding and appreciation. Pt questions where to get the compression stockings, I stated that he can go to a medical supply store. Does he need a script for those? Please advise, thanks

## 2015-06-09 NOTE — Telephone Encounter (Signed)
Ultrasound was negative. He needs to follow up with PCP. Recommend compression stockings and elevation.

## 2015-06-10 NOTE — Telephone Encounter (Signed)
Patient was told to follow up with PCP to see what PCP would want to do further. The injury of the hamstring is what is causing the swelling in legs.

## 2015-06-10 NOTE — Telephone Encounter (Signed)
Patient was called and is frustrated that he is not got any answers in what could be causing the swelling of his legs. He said the compression socks are something temporary, but wants to know why they could be swelling in the first place.he understands he does not have a clot, but would like to know what other steps he can take to figure out what the primary cause of the swelling could be.

## 2015-06-13 ENCOUNTER — Telehealth: Payer: Self-pay | Admitting: Family Medicine

## 2015-06-13 NOTE — Telephone Encounter (Signed)
Discussed with Linda

## 2015-06-13 NOTE — Telephone Encounter (Signed)
Spoke with pt to schedule appointment per Dr. Patsy Lageropland for Wednesday 06/15/15 also no weights, continue with compression and ice.  Pt wanted PCP to know that injury happened 4 weeks ago.

## 2015-06-13 NOTE — Telephone Encounter (Signed)
Pt still has swelling in left leg and would like to be seen by PCP today.  Please  Advise. Best number to call pt is [phone] / lt

## 2015-06-13 NOTE — Telephone Encounter (Signed)
Will eval on wed.

## 2015-06-15 ENCOUNTER — Ambulatory Visit (INDEPENDENT_AMBULATORY_CARE_PROVIDER_SITE_OTHER): Payer: 59 | Admitting: Family Medicine

## 2015-06-15 ENCOUNTER — Encounter: Payer: Self-pay | Admitting: Family Medicine

## 2015-06-15 VITALS — BP 140/86 | HR 61 | Temp 98.7°F | Ht 66.25 in | Wt 197.2 lb

## 2015-06-15 DIAGNOSIS — S76312D Strain of muscle, fascia and tendon of the posterior muscle group at thigh level, left thigh, subsequent encounter: Secondary | ICD-10-CM

## 2015-06-15 DIAGNOSIS — R600 Localized edema: Secondary | ICD-10-CM | POA: Diagnosis not present

## 2015-06-15 NOTE — Progress Notes (Signed)
Pre visit review using our clinic review tool, if applicable. No additional management support is needed unless otherwise documented below in the visit note. 

## 2015-06-15 NOTE — Progress Notes (Signed)
Dr. Karleen Hampshire T. Valdez Brannan, MD, CAQ [facility] [address] [place], [address] Phone: [phone] Fax: [phone]  06/15/2015  Patient: Max Peters, MRN: [id number], DOB: 1960-07-20, 55 y.o.  Primary Physician:  Hannah Beat, MD   Chief Complaint  Patient presents with  . Leg Swelling    after Hamstring injury 4 weeks ago   Subjective:   Max Peters is a 55 y.o. very pleasant male patient who presents with the following:  4 weeks ago Monday, tore his left hamstring defect, had a big defect and lower leg got swollen. Still had a lot of lower swelling. Definitely.   He felt a pop and or retraction and then subsequently developed some bruising in this area in the midportion of her is in her 5 on the posterior.  In his thigh.  At that point he ice,  Used compression, and this is steadily improved.  He is back at the gym already, and he is doing a lot of lower extremity weights with low weight.  There is a palpable defect.  He is most concerned about some swelling that he has had mostly on the left.  He previously saw a different provider, and they did an ultrasound of the left lower extremity, which ruled out DVT.  I talk with him about his also previously at least once and felt that he was having some dependent edema.  He does have chronic kidney disease stage III, but is otherwise relatively pretty healthy.  Past Medical History, Surgical History, Social History, Family History, Problem List, Medications, and Allergies have been reviewed and updated if relevant.  Patient Active Problem List   Diagnosis Date Noted  . Leg edema, left 06/08/2015  . Chronic kidney disease (CKD), stage III (moderate) 01/11/2014  . Insomnia 11/07/2010  . ALLERGIC RHINITIS, CHRONIC 03/23/2010  . Essential hypertension, benign 11/16/2008    Past Medical History  Diagnosis Date  . Hypertension   . Hypogonadism male   . Allergy     seasonal  .  Chronic kidney disease (CKD), stage III (moderate) 01/11/2014    Past Surgical History  Procedure Laterality Date  . Shoulder surgery      Social History   Social History  . Marital Status: Married    Spouse Name: N/A  . Number of Children: N/A  . Years of Education: N/A   Occupational History  . [employer]    Social History Main Topics  . Smoking status: Never Smoker   . Smokeless tobacco: Never Used  . Alcohol Use: Yes     Comment: occassionally  . Drug Use: No  . Sexual Activity: Not on file   Other Topics Concern  . Not on file   Social History Narrative   Regular exercise--yes    Family History  Problem Relation Age of Onset  . Colon cancer Neg Hx     No Known Allergies  Medication list reviewed and updated in full in [facility].  GEN: No fevers, chills. Nontoxic. Primarily MSK c/o today. MSK: Detailed in the HPI GI: tolerating PO intake without difficulty Neuro: No numbness, parasthesias, or tingling associated. Otherwise the pertinent positives of the ROS are noted above.   Objective:   BP 140/86 mmHg  Pulse 61  Temp(Src) 98.7 F (37.1 C) (Oral)  Ht 5' 6.25" (1.683 m)  Wt 197 lb 4 oz (89.472 kg)  BMI 31.59 kg/m2   GEN: WDWN, NAD, Non-toxic, Alert &  Oriented x 3 HEENT: Atraumatic, Normocephalic.  Ears and Nose: No external deformity. EXTR: No clubbing/cyanosis/1+ LE edema on the L NEURO: Normal gait.  PSYCH: Normally interactive. Conversant. Not depressed or anxious appearing.  Calm demeanor.    Strength testing on the right and left healed 5/5 on the right throughout.  Quad testing on the left is 5/5.  Eccentric hamstring testing on the left is 5/5 at 90 and 30.  There appears to be a muscular defect on the inner aspect of the patient's hamstring medially.  There is no bruising at this point.  Radiology: [place] Venous Img Lower Unilateral Left  06/08/2015  CLINICAL DATA:  Left leg swelling for 3 weeks as a  result of the hamstring injury. EXAM: Left LOWER EXTREMITY VENOUS DOPPLER ULTRASOUND TECHNIQUE: Gray-scale sonography with graded compression, as well as color Doppler and duplex ultrasound were performed to evaluate the lower extremity deep venous systems from the level of the common femoral vein and including the common femoral, femoral, profunda femoral, popliteal and calf veins including the posterior tibial, peroneal and gastrocnemius veins when visible. The superficial great saphenous vein was also interrogated. Spectral Doppler was utilized to evaluate flow at rest and with distal augmentation maneuvers in the common femoral, femoral and popliteal veins. COMPARISON:  None. FINDINGS: Contralateral Common Femoral Vein: Respiratory phasicity is normal and symmetric with the symptomatic side. No evidence of thrombus. Normal compressibility. Common Femoral Vein: No evidence of thrombus. Normal compressibility, respiratory phasicity and response to augmentation. Saphenofemoral Junction: No evidence of thrombus. Normal compressibility and flow on color Doppler imaging. Profunda Femoral Vein: No evidence of thrombus. Normal compressibility and flow on color Doppler imaging. Femoral Vein: No evidence of thrombus. Normal compressibility, respiratory phasicity and response to augmentation. Popliteal Vein: No evidence of thrombus. Normal compressibility, respiratory phasicity and response to augmentation. Calf Veins: No evidence of thrombus. Normal compressibility and flow on color Doppler imaging. Superficial Great Saphenous Vein: No evidence of thrombus. Normal compressibility and flow on color Doppler imaging. Venous Reflux:  None. Other Findings:  None. IMPRESSION: No evidence of deep venous thrombosis. Electronically Signed   By: Burman NievesWilliam  Stevens M.D.   On: 06/08/2015 20:26   Comprehensive Metabolic Panel:    Component Value Date/Time   NA 138 02/23/2015 0837   K 4.7 02/23/2015 0837   CL 100 02/23/2015 0837     CO2 32 02/23/2015 0837   BUN 34* 02/23/2015 0837   CREATININE 1.57* 02/23/2015 0837   GLUCOSE 91 02/23/2015 0837   CALCIUM 9.4 02/23/2015 0837   AST 34 02/23/2015 0837   ALT 27 02/23/2015 0837   ALKPHOS 49 02/23/2015 0837   BILITOT 0.4 02/23/2015 0837   PROT 6.6 02/23/2015 0837   ALBUMIN 4.2 02/23/2015 0837    Assessment and Plan:   Tear of left hamstring, subsequent encounter  Leg edema, left  Grade 2 medial hamstring tear, semimembranosus bursa semitendinosus, doing much better than anticipated at 4 weeks.  Lower extremity edema, suspect that the patient isn't having dependent edema which I discussed with him previously, and this is worsened by acute hamstring tear on the left.  I would anticipate that this will improve gradually over time.  Follow-up: No Follow-up on file.  Signed,  Elpidio GaleaSpencer T. Ewen Varnell, MD   Patient's Medications  New Prescriptions   No medications on file  Previous Medications   AMITRIPTYLINE (ELAVIL) 10 MG TABLET    TAKE 1 TABLET (10 MG TOTAL) BY MOUTH AT BEDTIME AS NEEDED.   CARVEDILOL (COREG)  3.125 MG TABLET    Take 1 tablet (3.125 mg total) by mouth 2 (two) times daily with a meal.   FLUTICASONE (FLONASE) 50 MCG/ACT NASAL SPRAY    USE 2 SPRAYS IN EACH NOSTRIL DAILY   LISINOPRIL (PRINIVIL,ZESTRIL) 40 MG TABLET    Take 1 tablet (40 mg total) by mouth daily.   MELATONIN 10 MG TABS    Take 1 tablet by mouth at bedtime.   SILDENAFIL (REVATIO) 20 MG TABLET    Generic Revatio / Sildanefil 20 mg. 2 - 5 tabs 30 mins prior to intercourse. #90, 11 refills.   SILODOSIN (RAPAFLO) 8 MG CAPS CAPSULE    Take 1 capsule (8 mg total) by mouth daily with breakfast.   TERBINAFINE (LAMISIL) 250 MG TABLET    TAKE 1 TABLET (250 MG TOTAL) BY MOUTH DAILY.   TESTOSTERONE CYPIONATE (DEPO-TESTOSTERONE) 200 MG/ML INJECTION    Inject 0.5 mLs (100 mg total) into the muscle once a week.   TRAZODONE (DESYREL) 100 MG TABLET    Take 1 tablet (100 mg total) by mouth at bedtime as  needed. for sleep  Modified Medications   No medications on file  Discontinued Medications   No medications on file

## 2015-06-17 ENCOUNTER — Other Ambulatory Visit (INDEPENDENT_AMBULATORY_CARE_PROVIDER_SITE_OTHER): Payer: 59

## 2015-06-17 DIAGNOSIS — E291 Testicular hypofunction: Secondary | ICD-10-CM | POA: Diagnosis not present

## 2015-06-18 ENCOUNTER — Other Ambulatory Visit: Payer: Self-pay | Admitting: Family Medicine

## 2015-06-20 ENCOUNTER — Other Ambulatory Visit: Payer: Self-pay | Admitting: *Deleted

## 2015-06-20 MED ORDER — SILDENAFIL CITRATE 20 MG PO TABS: ORAL_TABLET | ORAL | Status: DC

## 2015-06-21 LAB — TESTOS,TOTAL,FREE AND SHBG (FEMALE)
Sex Hormone Binding Glob.: 31 nmol/L (ref 10–50)
TESTOSTERONE,FREE: 248.4 pg/mL — AB (ref 35.0–155.0)
TESTOSTERONE,TOTAL,LC/MS/MS: 1099 ng/dL (ref 250–1100)

## 2015-06-23 ENCOUNTER — Telehealth: Payer: Self-pay | Admitting: *Deleted

## 2015-06-23 DIAGNOSIS — E349 Endocrine disorder, unspecified: Secondary | ICD-10-CM

## 2015-06-23 NOTE — Telephone Encounter (Signed)
-----   Message from Hannah BeatSpencer Copland, MD sent at 06/23/2015  9:35 AM EDT ----- Let's drop the dose to 0.4 mL - the easiest way to do that will be with a 1 mL syringe.   I would set up a lab recheck in 2 months on a Friday morning. (pretty sure he does his dose on Saturday.) Can you or Terri set up a lab draw for then?  Total and Free testosterone: E29.1

## 2015-06-23 NOTE — Telephone Encounter (Signed)
Mr. Max Peters notified by telephone to decrease his testosterone injection down to 0.4 ml.  He states that the syringes he already has are marked in 0.1 ml increments so he will be able to use what he has.  Lab appointment scheduled for 08/26/2015 at 8:05 am to recheck testosterone level.

## 2015-08-04 ENCOUNTER — Encounter: Payer: Self-pay | Admitting: Family Medicine

## 2015-08-04 ENCOUNTER — Ambulatory Visit (INDEPENDENT_AMBULATORY_CARE_PROVIDER_SITE_OTHER): Payer: 59 | Admitting: Family Medicine

## 2015-08-04 VITALS — BP 162/104 | HR 65 | Temp 97.9°F | Ht 66.5 in | Wt 196.5 lb

## 2015-08-04 DIAGNOSIS — H6123 Impacted cerumen, bilateral: Secondary | ICD-10-CM

## 2015-08-04 DIAGNOSIS — R600 Localized edema: Secondary | ICD-10-CM

## 2015-08-04 NOTE — Patient Instructions (Signed)
Use the debrox daily.  We will call with your appts as soon as we can.  Take care  Dr. Adriana Simasook

## 2015-08-04 NOTE — Progress Notes (Signed)
Pre visit review using our clinic review tool, if applicable. No additional management support is needed unless otherwise documented below in the visit note. 

## 2015-08-05 DIAGNOSIS — R600 Localized edema: Secondary | ICD-10-CM | POA: Insufficient documentation

## 2015-08-05 DIAGNOSIS — H6120 Impacted cerumen, unspecified ear: Secondary | ICD-10-CM | POA: Insufficient documentation

## 2015-08-05 NOTE — Assessment & Plan Note (Signed)
New problem. Irrigation was performed numerous times today and was unsuccessful. I encouraged use of Debrox at home. I encouraged ENT referral but he is hesitant about this. I don't see any other good option at this point in time. Will send to ENT as soon as possible.

## 2015-08-05 NOTE — Assessment & Plan Note (Addendum)
Established problem/chronic, worsening/persistent. Most recent labs and PCP visit were reviewed today. In summary (PCP visit) - patient was evaluated and clinical picture was consistent with a hamstring tear. PCP stated that he anticipated the edema would slowly improve over time and that no other intervention was needed. I expect it more improvement following hamstring injury if this was the complete picture. I suspect he has a component of venous insufficiency. CKD could be contributing but given his stage of CKD as well as the fact that L>R makes this unlikely. Sending to Vascular for evaluation.

## 2015-08-05 NOTE — Progress Notes (Signed)
Subjective:  Patient ID: Max Peters, male    DOB: 01/17/1961  Age: 55 y.o. MRN: [id number]  CC: Ear pain, Leg edema  HPI:  [age] year old male presents with the above complaints.  Patient states that he's recently developed ear pain. He also reports decreased hearing. No reported URI complaints. No associated fevers or chills. No known exacerbating or relieving factors.  Additionally, patient continues to complain of leg edema particularly of the left leg. I initially saw him on 3/22. At that time he had and hamstring injury/tear. Lower extremity Doppler was obtained to rule out DVT. No DVT was seen. He has follow-up with his primary physician who felt like the edema in his leg was secondary to the tear. He states it is been approximately 3 months and he is concerned/annoyed by the significant edema. He is wearing compression stockings with some improvement. He states that he's back at the gym doing his normal activities but is not lifting quite as heavy regarding his legs. No associated shortness of breath. He does state that he has right lower extremity edema but is not as prominent as the left. No other complaints at this time.  Social Hx   Social History   Social History  . Marital Status: Married    Spouse Name: N/A  . Number of Children: N/A  . Years of Education: N/A   Occupational History  . [employer]    Social History Main Topics  . Smoking status: Never Smoker   . Smokeless tobacco: Never Used  . Alcohol Use: Yes     Comment: occassionally  . Drug Use: No  . Sexual Activity: Not Asked   Other Topics Concern  . None   Social History Narrative   Regular exercise--yes   Review of Systems  HENT: Positive for ear pain.   Cardiovascular: Positive for leg swelling.   Objective:  BP 162/104 mmHg  Pulse 65  Temp(Src) 97.9 F (36.6 C) (Oral)  Ht 5' 6.5" (1.689 m)  Wt 196 lb 8 oz (89.132 kg)  BMI 31.24 kg/m2  SpO2 97%  BP/Weight 08/04/2015  06/15/2015 06/08/2015  Systolic BP 162 140 158  Diastolic BP 104 86 102  Wt. (Lbs) 196.5 197.25 198.2  BMI 31.24 31.59 31.29   Physical Exam  Constitutional: He is oriented to person, place, and time. He appears well-developed. No distress.  HENT:  Head: Normocephalic and atraumatic.  TMs obscured bilaterally by cerumen impaction.  Cardiovascular:  1+ lower extremity edema left leg. Trace of the right.  Pulmonary/Chest: Effort normal.  Neurological: He is alert and oriented to person, place, and time.  Psychiatric: He has a normal mood and affect.  Vitals reviewed.  Lab Results  Component Value Date   WBC 4.6 02/23/2015   HGB 16.3 02/23/2015   HCT 48.6 02/23/2015   PLT 245.0 02/23/2015   GLUCOSE 91 02/23/2015   CHOL 105 02/23/2015   TRIG 85.0 02/23/2015   HDL 49.80 02/23/2015   LDLCALC 38 02/23/2015   ALT 27 02/23/2015   AST 34 02/23/2015   NA 138 02/23/2015   K 4.7 02/23/2015   CL 100 02/23/2015   CREATININE 1.57* 02/23/2015   BUN 34* 02/23/2015   CO2 32 02/23/2015   TSH 3.32 01/08/2014   PSA 3.20 02/23/2015   Assessment & Plan:   Problem List Items Addressed This Visit    Leg edema, left    Established problem/chronic, worsening/persistent. Most recent labs and PCP visit were reviewed today. In summary (  PCP visit) - patient was evaluated and clinical picture was consistent with a hamstring tear. PCP stated that he anticipated the edema would slowly improve over time and that no other intervention was needed. I expect it more improvement following hamstring injury if this was the complete picture. I suspect he has a component of venous insufficiency. CKD could be contributing but given his stage of CKD as well as the fact that L>R makes this unlikely. Sending to Vascular for evaluation.      Relevant Orders   Ambulatory referral to Vascular Surgery   Cerumen impaction - Primary    New problem. Irrigation was performed numerous times today and was unsuccessful. I  encouraged use of Debrox at home. I encouraged ENT referral but he is hesitant about this. I don't see any other good option at this point in time. Will send to ENT as soon as possible.      Relevant Orders   Ambulatory referral to ENT     Follow-up: PRN  Everlene Other DO [facility]

## 2015-08-17 ENCOUNTER — Other Ambulatory Visit: Payer: Self-pay | Admitting: *Deleted

## 2015-08-17 ENCOUNTER — Other Ambulatory Visit: Payer: Self-pay | Admitting: Family Medicine

## 2015-08-17 DIAGNOSIS — R609 Edema, unspecified: Secondary | ICD-10-CM

## 2015-08-26 ENCOUNTER — Other Ambulatory Visit (INDEPENDENT_AMBULATORY_CARE_PROVIDER_SITE_OTHER): Payer: 59

## 2015-08-26 DIAGNOSIS — E291 Testicular hypofunction: Secondary | ICD-10-CM

## 2015-08-26 DIAGNOSIS — E349 Endocrine disorder, unspecified: Secondary | ICD-10-CM

## 2015-08-30 LAB — TESTOS,TOTAL,FREE AND SHBG (FEMALE)
SEX HORMONE BINDING GLOB.: 32 nmol/L (ref 10–50)
TESTOSTERONE,FREE: 222.3 pg/mL — AB (ref 35.0–155.0)
TESTOSTERONE,TOTAL,LC/MS/MS: 958 ng/dL (ref 250–1100)

## 2015-08-31 ENCOUNTER — Other Ambulatory Visit: Payer: Self-pay | Admitting: Family Medicine

## 2015-08-31 DIAGNOSIS — E349 Endocrine disorder, unspecified: Secondary | ICD-10-CM

## 2015-09-24 ENCOUNTER — Other Ambulatory Visit: Payer: Self-pay | Admitting: Family Medicine

## 2015-09-24 NOTE — Telephone Encounter (Signed)
Last office visit 08/04/2015 with Dr. Adriana Simasook at [place] [facility].  Ok to refill?

## 2015-09-26 ENCOUNTER — Other Ambulatory Visit: Payer: Self-pay | Admitting: *Deleted

## 2015-09-26 MED ORDER — TESTOSTERONE CYPIONATE 200 MG/ML IM SOLN: 50.0000 mg | INTRAMUSCULAR | Status: DC

## 2015-09-26 NOTE — Telephone Encounter (Signed)
Last office visit 08/04/2015 [facility].  Last refilled 03/16/2015 for 2 ml with 5 refills.  Ok to refill?

## 2015-09-26 NOTE — Telephone Encounter (Signed)
Done, script changed to current dose

## 2015-10-07 ENCOUNTER — Other Ambulatory Visit: Payer: 59

## 2015-10-10 ENCOUNTER — Other Ambulatory Visit: Payer: Self-pay | Admitting: Family Medicine

## 2015-10-14 ENCOUNTER — Other Ambulatory Visit (INDEPENDENT_AMBULATORY_CARE_PROVIDER_SITE_OTHER): Payer: 59

## 2015-10-14 DIAGNOSIS — E291 Testicular hypofunction: Secondary | ICD-10-CM

## 2015-10-14 DIAGNOSIS — E349 Endocrine disorder, unspecified: Secondary | ICD-10-CM

## 2015-10-18 LAB — TESTOS,TOTAL,FREE AND SHBG (FEMALE)
Sex Hormone Binding Glob.: 36 nmol/L (ref 10–50)
TESTOSTERONE,TOTAL,LC/MS/MS: 859 ng/dL (ref 250–1100)
Testosterone, Free: 158.1 pg/mL — ABNORMAL HIGH (ref 35.0–155.0)

## 2015-11-03 ENCOUNTER — Encounter: Payer: Self-pay | Admitting: *Deleted

## 2015-11-03 ENCOUNTER — Other Ambulatory Visit: Payer: Self-pay | Admitting: Family Medicine

## 2015-11-03 ENCOUNTER — Encounter (HOSPITAL_COMMUNITY): Payer: 59

## 2015-11-03 ENCOUNTER — Telehealth: Payer: Self-pay | Admitting: *Deleted

## 2015-11-03 ENCOUNTER — Encounter: Payer: 59 | Admitting: Vascular Surgery

## 2015-11-03 MED ORDER — TESTOSTERONE CYPIONATE 200 MG/ML IM SOLN: 100.0000 mg | INTRAMUSCULAR | 5 refills | Status: DC

## 2015-11-03 NOTE — Telephone Encounter (Signed)
Received fax from [facility] requesting PA for Testosterone Cypionate.  PA completed on CoverMyMeds.  Awaiting decision.

## 2015-11-07 NOTE — Telephone Encounter (Signed)
PA approved from 11/06/2015 through 11/05/2016.  [facility] notified.

## 2016-01-25 ENCOUNTER — Other Ambulatory Visit: Payer: Self-pay | Admitting: Family Medicine

## 2016-03-20 ENCOUNTER — Other Ambulatory Visit: Payer: Self-pay | Admitting: Family Medicine

## 2016-03-31 ENCOUNTER — Other Ambulatory Visit: Payer: Self-pay | Admitting: Family Medicine

## 2016-04-04 ENCOUNTER — Other Ambulatory Visit: Payer: Self-pay | Admitting: Family Medicine

## 2016-04-06 NOTE — Telephone Encounter (Signed)
?   Dr Copland's pt  

## 2016-04-12 ENCOUNTER — Other Ambulatory Visit: Payer: Self-pay | Admitting: Family Medicine

## 2016-04-16 ENCOUNTER — Ambulatory Visit (INDEPENDENT_AMBULATORY_CARE_PROVIDER_SITE_OTHER): Payer: 59 | Admitting: Family

## 2016-04-16 ENCOUNTER — Encounter: Payer: Self-pay | Admitting: Family

## 2016-04-16 VITALS — BP 162/100 | HR 60 | Temp 97.7°F | Ht 67.0 in | Wt 198.8 lb

## 2016-04-16 DIAGNOSIS — J40 Bronchitis, not specified as acute or chronic: Secondary | ICD-10-CM | POA: Diagnosis not present

## 2016-04-16 DIAGNOSIS — I10 Essential (primary) hypertension: Secondary | ICD-10-CM | POA: Diagnosis not present

## 2016-04-16 MED ORDER — BENZONATATE 100 MG PO CAPS: 100.0000 mg | ORAL_CAPSULE | Freq: Three times a day (TID) | ORAL | 1 refills | Status: DC | PRN

## 2016-04-16 MED ORDER — HYDROCODONE-HOMATROPINE 5-1.5 MG/5ML PO SYRP: 5.0000 mL | ORAL_SOLUTION | Freq: Every evening | ORAL | 0 refills | Status: DC | PRN

## 2016-04-16 NOTE — Assessment & Plan Note (Signed)
Afebrile. No respiratory distress. Agreed on conservative management with daytime and nighttime cough medication. Return precautions given.

## 2016-04-16 NOTE — Progress Notes (Signed)
Pre visit review using our clinic review tool, if applicable. No additional management support is needed unless otherwise documented below in the visit note. 

## 2016-04-16 NOTE — Assessment & Plan Note (Signed)
Elevated. No CP, SOB. Education provided. Advised BP cuff and to take carvedilol early today. Suspect amount of OTC cough/cold preparations have caused. Patient will call this afternoon with reading.

## 2016-04-16 NOTE — Progress Notes (Signed)
Subjective:    Patient ID: Max HaiRoger D Maners, male    DOB: 11/15/1960, 56 y.o.   MRN: [id number]  CC: Max Peters is a 56 y.o. male who presents today for an acute visit.    HPI: CC: cough 5 days, unchanged. Worse at night. Endorses sneezing.Tried delsym, alseltter not working. Np CP, SOB, fever, sinus pressure, ear pain.   H/o seasonal allergies, CKD  No BP cuff at home.         HISTORY:  Past Medical History:  Diagnosis Date  . Allergy    seasonal  . Chronic kidney disease (CKD), stage III (moderate) 01/11/2014  . Hypertension   . Hypogonadism male    Past Surgical History:  Procedure Laterality Date  . SHOULDER SURGERY     Family History  Problem Relation Age of Onset  . Colon cancer Neg Hx     Allergies: Patient has no known allergies. Current Outpatient Prescriptions on File Prior to Visit  Medication Sig Dispense Refill  . amitriptyline (ELAVIL) 10 MG tablet TAKE ONE TABLET BY MOUTH AT BEDTIME AS NEEDED 30 tablet 5  . carvedilol (COREG) 3.125 MG tablet TAKE 1 TABLET BY MOUTH TWICE DAILY WITH A MEAL 60 tablet 0  . fluticasone (FLONASE) 50 MCG/ACT nasal spray USE 2 SPRAYS IN EACH NOSTRIL DAILY 16 g 2  . lisinopril (PRINIVIL,ZESTRIL) 40 MG tablet TAKE ONE TABLET BY MOUTH DAILY 30 tablet 0  . Melatonin 10 MG TABS Take 1 tablet by mouth at bedtime.    Marland Kitchen. RAPAFLO 8 MG CAPS capsule TAKE 1 CAPSULE BY MOUTH EVERY DAY WITH BREAKFAST 30 each 0  . sildenafil (REVATIO) 20 MG tablet Generic Revatio / Sildanefil 20 mg. 2 - 5 tabs 30 mins prior to intercourse 90 tablet 11  . terbinafine (LAMISIL) 250 MG tablet TAKE ONE TABLET EVERY DAY 30 tablet 2  . testosterone cypionate (DEPO-TESTOSTERONE) 200 MG/ML injection Inject 0.5 mLs (100 mg total) into the muscle once a week. 2 mL 5  . traZODone (DESYREL) 100 MG tablet TAKE ONE TABLET AT BEDTIME AS NEEDED FORSLEEP 30 tablet 0   No current facility-administered medications on file prior to visit.     Social History  Substance Use  Topics  . Smoking status: Never Smoker  . Smokeless tobacco: Never Used  . Alcohol use Yes     Comment: occassionally    Review of Systems  Constitutional: Negative for chills and fever.  HENT: Positive for rhinorrhea. Negative for congestion, ear pain, sinus pressure and sore throat.   Eyes: Negative for visual disturbance.  Respiratory: Positive for cough. Negative for shortness of breath and wheezing.   Cardiovascular: Negative for chest pain, palpitations and leg swelling.  Gastrointestinal: Negative for diarrhea, nausea and vomiting.  Musculoskeletal: Negative for myalgias.  Skin: Negative for rash.  Neurological: Negative for headaches.  Hematological: Negative for adenopathy.  Psychiatric/Behavioral: Negative for confusion.      Objective:    BP (!) 162/100   Pulse 60   Temp 97.7 F (36.5 C) (Oral)   Ht 5\' 7"  (1.702 m)   Wt 198 lb 12.8 oz (90.2 kg)   SpO2 99%   BMI 31.14 kg/m   BP Readings from Last 3 Encounters:  04/16/16 (!) 162/100  08/04/15 (!) 162/104  06/15/15 140/86     Physical Exam  Constitutional: Vital signs are normal. He appears well-developed and well-nourished.  HENT:  Head: Normocephalic and atraumatic.  Right Ear: Hearing, tympanic membrane, external ear and ear canal  normal. No drainage, swelling or tenderness. Tympanic membrane is not injected, not erythematous and not bulging. No middle ear effusion. No decreased hearing is noted.  Left Ear: Hearing, tympanic membrane, external ear and ear canal normal. No drainage, swelling or tenderness. Tympanic membrane is not injected, not erythematous and not bulging.  No middle ear effusion. No decreased hearing is noted.  Nose: Nose normal. Right sinus exhibits no maxillary sinus tenderness and no frontal sinus tenderness. Left sinus exhibits no maxillary sinus tenderness and no frontal sinus tenderness.  Mouth/Throat: Uvula is midline, oropharynx is clear and moist and mucous membranes are normal. No  oropharyngeal exudate, posterior oropharyngeal edema, posterior oropharyngeal erythema or tonsillar abscesses.  Eyes: Conjunctivae are normal.  Cardiovascular: Regular rhythm and normal heart sounds.   Pulmonary/Chest: Effort normal and breath sounds normal. No respiratory distress. He has no wheezes. He has no rhonchi. He has no rales.  Lymphadenopathy:       Head (right side): No submental, no submandibular, no tonsillar, no preauricular, no posterior auricular and no occipital adenopathy present.       Head (left side): No submental, no submandibular, no tonsillar, no preauricular, no posterior auricular and no occipital adenopathy present.    He has no cervical adenopathy.  Neurological: He is alert.  Skin: Skin is warm and dry.  Psychiatric: He has a normal mood and affect. His speech is normal and behavior is normal.  Vitals reviewed.      Assessment & Plan:   Problem List Items Addressed This Visit      Cardiovascular and Mediastinum   Essential hypertension, benign    Elevated. No CP, SOB. Education provided. Advised BP cuff and to take carvedilol early today. Suspect amount of OTC cough/cold preparations have caused. Patient will call this afternoon with reading.        Respiratory   Bronchitis - Primary    Afebrile. No respiratory distress. Agreed on conservative management with daytime and nighttime cough medication. Return precautions given.       Relevant Medications   HYDROcodone-homatropine (HYCODAN) 5-1.5 MG/5ML syrup   benzonatate (TESSALON PERLES) 100 MG capsule        I am having Mr. Betke start on HYDROcodone-homatropine and benzonatate. I am also having him maintain his Melatonin, fluticasone, sildenafil, terbinafine, testosterone cypionate, amitriptyline, lisinopril, RAPAFLO, carvedilol, and traZODone.   Meds ordered this encounter  Medications  . HYDROcodone-homatropine (HYCODAN) 5-1.5 MG/5ML syrup    Sig: Take 5 mLs by mouth at bedtime as needed for  cough.    Dispense:  30 mL    Refill:  0    Order Specific Question:   Supervising Provider    Answer:   Darrick Huntsman, TERESA L [[id number]]  . benzonatate (TESSALON PERLES) 100 MG capsule    Sig: Take 1 capsule (100 mg total) by mouth 3 (three) times daily as needed for cough.    Dispense:  30 capsule    Refill:  1    Order Specific Question:   Supervising Provider    Answer:   Sherlene Shams [[id number]]    Return precautions given.   Risks, benefits, and alternatives of the medications and treatment plan prescribed today were discussed, and patient expressed understanding.   Education regarding symptom management and diagnosis given to patient on AVS.  Continue to follow with Hannah Beat, MD for routine health maintenance.   Max Hai and I agreed with plan.   Rennie Plowman, FNP

## 2016-04-16 NOTE — Patient Instructions (Addendum)
STOP all medications with SINUS OR DECONGESTANT.   Call with BP reading this afternoon ; goal < 140/90.   Take the carvedilol as soon as you can.   Be vigilant with any signs of heart attack or stroke as discussed.   Increase intake of clear fluids. Congestion is best treated by hydration, when mucus is wetter, it is thinner, less sticky, and easier to expel from the body, either through coughing up drainage, or by blowing your nose.   Get plenty of rest.   Use saline nasal drops and blow your nose frequently. Run a humidifier at night and elevate the head of the bed. Vicks Vapor rub will help with congestion and cough. Steam showers and sinus massage for congestion.   Use Acetaminophen or Ibuprofen as needed for fever or pain. Avoid second hand smoke. Even the smallest exposure will worsen symptoms.   Over the counter medications you can try include Delsym for cough, a decongestant for congestion, and Mucinex or Robitussin as an expectorant. Be sure to just get the plain Mucinex or Robitussin that just has one medication (Guaifenesen). We don't recommend the combination products. Note, be sure to drink two glasses of water with each dose of Mucinex as the medication will not work well without adequate hydration.   You can also try a teaspoon of honey to see if this will help reduce cough. Throat lozenges can sometimes be beneficial as well.    This illness will typically last 7 - 10 days.   Please follow up with our clinic if you develop a fever greater than 101 F, symptoms worsen, or do not resolve in the next week.

## 2016-04-20 ENCOUNTER — Telehealth: Payer: Self-pay | Admitting: *Deleted

## 2016-04-20 MED ORDER — PROMETHAZINE-DM 6.25-15 MG/5ML PO SYRP: 5.0000 mL | ORAL_SOLUTION | Freq: Four times a day (QID) | ORAL | 0 refills | Status: DC | PRN

## 2016-04-20 MED ORDER — GUAIFENESIN-CODEINE 100-10 MG/5ML PO SYRP: 5.0000 mL | ORAL_SOLUTION | Freq: Three times a day (TID) | ORAL | 0 refills | Status: DC | PRN

## 2016-04-20 NOTE — Telephone Encounter (Signed)
Pt has requested to have a night time cough medication called into the phamrmacy along with a day time formula. Pt continues to have a cough . He was last seen in this office on 04/16/16. Pt contact [phone]

## 2016-04-20 NOTE — Telephone Encounter (Signed)
Patient advised of below and verbalized understanding Script faxed

## 2016-04-20 NOTE — Telephone Encounter (Signed)
Reason for call:cough Symptoms: cough yellow phlegm green mucus, not taken temp  Duration 5 days Medications: Tessalon Perles , no help, out of hydrocodone syrup  Last seen for this problem:04/16/16 Seen by Claris CheMargaret Arnett,Fnp

## 2016-04-20 NOTE — Telephone Encounter (Signed)
I cannot call in a refill on the tussionex bc it is a controlled substance.  I can call in cheratussin with codeine for  and phenergan/dextromethorphan  cough syrup  ,

## 2016-04-25 ENCOUNTER — Other Ambulatory Visit: Payer: Self-pay | Admitting: Family Medicine

## 2016-04-26 NOTE — Telephone Encounter (Signed)
Letter mailed to Mr. Adline PotterGunn to call the office and schedule CPE with Dr. Patsy Lageropland.

## 2016-04-26 NOTE — Telephone Encounter (Signed)
Ok to refill 90 day supply, 0 ref  F/u CPX in the spring at his convenience

## 2016-05-03 ENCOUNTER — Other Ambulatory Visit: Payer: Self-pay | Admitting: Family Medicine

## 2016-05-08 ENCOUNTER — Other Ambulatory Visit: Payer: Self-pay | Admitting: Family Medicine

## 2016-05-24 ENCOUNTER — Other Ambulatory Visit: Payer: Self-pay | Admitting: Family Medicine

## 2016-06-02 ENCOUNTER — Other Ambulatory Visit: Payer: Self-pay | Admitting: Family Medicine

## 2016-06-03 NOTE — Telephone Encounter (Signed)
Last office visit 06/15/15.  Last CPE 03/16/15.  Last refills state needs office visit.  Letter was also mailed to patient 04/26/16 asking him to call the office to schedule appointment  No future appointments scheduled.  Refill?

## 2016-06-04 NOTE — Telephone Encounter (Addendum)
Testosterone called into [facility] - [place] - [id number] Phone: [phone]. CPE scheduled for 06/13/2016 at 8:00 am with Dr. Patsy Lageropland.  Patient will come in fasting for same day labs.

## 2016-06-04 NOTE — Telephone Encounter (Signed)
Ok to refill both 1 month only.  I want someone from our office to verbally speak to him and make a follow-up appointment with me. It really needs to be between 8-9 AM. He is significantly overdue for his bloodwork. I won't refill his testosterone any further until he comes in to the office.

## 2016-06-13 ENCOUNTER — Telehealth: Payer: Self-pay | Admitting: *Deleted

## 2016-06-13 ENCOUNTER — Encounter: Payer: Self-pay | Admitting: *Deleted

## 2016-06-13 ENCOUNTER — Encounter (INDEPENDENT_AMBULATORY_CARE_PROVIDER_SITE_OTHER): Payer: Self-pay

## 2016-06-13 ENCOUNTER — Encounter: Payer: Self-pay | Admitting: Family Medicine

## 2016-06-13 ENCOUNTER — Ambulatory Visit (INDEPENDENT_AMBULATORY_CARE_PROVIDER_SITE_OTHER): Payer: 59 | Admitting: Family Medicine

## 2016-06-13 VITALS — BP 162/100 | HR 67 | Temp 98.3°F | Ht 66.25 in | Wt 196.5 lb

## 2016-06-13 DIAGNOSIS — Z79899 Other long term (current) drug therapy: Secondary | ICD-10-CM | POA: Diagnosis not present

## 2016-06-13 DIAGNOSIS — Z0000 Encounter for general adult medical examination without abnormal findings: Secondary | ICD-10-CM

## 2016-06-13 DIAGNOSIS — Z13220 Encounter for screening for lipoid disorders: Secondary | ICD-10-CM

## 2016-06-13 DIAGNOSIS — Z1159 Encounter for screening for other viral diseases: Secondary | ICD-10-CM | POA: Diagnosis not present

## 2016-06-13 DIAGNOSIS — Z114 Encounter for screening for human immunodeficiency virus [HIV]: Secondary | ICD-10-CM

## 2016-06-13 DIAGNOSIS — E291 Testicular hypofunction: Secondary | ICD-10-CM

## 2016-06-13 DIAGNOSIS — Z131 Encounter for screening for diabetes mellitus: Secondary | ICD-10-CM

## 2016-06-13 DIAGNOSIS — Z125 Encounter for screening for malignant neoplasm of prostate: Secondary | ICD-10-CM

## 2016-06-13 LAB — CBC WITH DIFFERENTIAL/PLATELET
Basophils Absolute: 0 10*3/uL (ref 0.0–0.1)
Basophils Relative: 0.6 % (ref 0.0–3.0)
EOS ABS: 0.2 10*3/uL (ref 0.0–0.7)
EOS PCT: 4.8 % (ref 0.0–5.0)
HCT: 47.4 % (ref 39.0–52.0)
HEMOGLOBIN: 16.2 g/dL (ref 13.0–17.0)
LYMPHS PCT: 27.3 % (ref 12.0–46.0)
Lymphs Abs: 0.9 10*3/uL (ref 0.7–4.0)
MCHC: 34.2 g/dL (ref 30.0–36.0)
MCV: 95.6 fl (ref 78.0–100.0)
MONO ABS: 0.5 10*3/uL (ref 0.1–1.0)
Monocytes Relative: 15.3 % — ABNORMAL HIGH (ref 3.0–12.0)
Neutro Abs: 1.7 10*3/uL (ref 1.4–7.7)
Neutrophils Relative %: 52 % (ref 43.0–77.0)
Platelets: 233 10*3/uL (ref 150.0–400.0)
RBC: 4.96 Mil/uL (ref 4.22–5.81)
RDW: 14.8 % (ref 11.5–15.5)
WBC: 3.2 10*3/uL — AB (ref 4.0–10.5)

## 2016-06-13 LAB — PSA: PSA: 4.34 ng/mL — ABNORMAL HIGH (ref 0.10–4.00)

## 2016-06-13 LAB — HEPATIC FUNCTION PANEL
ALK PHOS: 47 U/L (ref 39–117)
ALT: 20 U/L (ref 0–53)
AST: 35 U/L (ref 0–37)
Albumin: 4.5 g/dL (ref 3.5–5.2)
BILIRUBIN DIRECT: 0.1 mg/dL (ref 0.0–0.3)
Total Bilirubin: 0.6 mg/dL (ref 0.2–1.2)
Total Protein: 6.9 g/dL (ref 6.0–8.3)

## 2016-06-13 LAB — BASIC METABOLIC PANEL
BUN: 24 mg/dL — AB (ref 6–23)
CO2: 34 mEq/L — ABNORMAL HIGH (ref 19–32)
CREATININE: 1.53 mg/dL — AB (ref 0.40–1.50)
Calcium: 9.6 mg/dL (ref 8.4–10.5)
Chloride: 100 mEq/L (ref 96–112)
GFR: 50.22 mL/min — AB (ref >60.00)
Glucose, Bld: 104 mg/dL — ABNORMAL HIGH (ref 70–99)
POTASSIUM: 4.8 meq/L (ref 3.5–5.1)
Sodium: 139 mEq/L (ref 135–145)

## 2016-06-13 LAB — LIPID PANEL
CHOL/HDL RATIO: 2
Cholesterol: 119 mg/dL (ref 0–200)
HDL: 53.9 mg/dL (ref >39.00)
LDL CALC: 48 mg/dL (ref 0–99)
NONHDL: 64.64
Triglycerides: 82 mg/dL (ref 0.0–149.0)
VLDL: 16.4 mg/dL (ref 0.0–40.0)

## 2016-06-13 MED ORDER — TADALAFIL 5 MG PO TABS: 5.0000 mg | ORAL_TABLET | Freq: Every day | ORAL | 5 refills | Status: DC

## 2016-06-13 NOTE — Progress Notes (Signed)
Dr. Frederico Hamman T. Leilyn Frayre, MD, [facility] [place], [address] Phone: [phone] Fax: [phone]  06/13/2016  Patient: Max Peters, MRN: [id number], DOB: Jan 31, 1961, 56 y.o.  Primary Physician:  Owens Loffler, MD   Chief Complaint  Patient presents with  . Annual Exam   Subjective:   Max Peters is a 56 y.o. pleasant patient who presents with the following:  Preventative Health Maintenance Visit:  Health Maintenance Summary Reviewed and updated, unless pt declines services.  Tobacco History Reviewed. Alcohol: No concerns, no excessive use Exercise Habits: Some activity, rec at least 30 mins 5 times a week STD concerns: no risk or activity to increase risk Drug Use: None Encouraged self-testicular check  Libido not as good as before Has known BPH and on rapaflo  Health Maintenance  Topic Date Due  . Hepatitis C Screening  12-08-1960  . HIV Screening  01/14/1975  . INFLUENZA VACCINE  06/16/2016 (Originally 10/18/2015)  . COLONOSCOPY  06/02/2023  . TETANUS/TDAP  03/15/2025   Immunization History  Administered Date(s) Administered  . Influenza,inj,Quad PF,36+ Mos 01/07/2013, 03/16/2015  . Influenza-Unspecified 01/08/2014  . Tdap 03/16/2015   Patient Active Problem List   Diagnosis Date Noted  . Leg edema, left 06/08/2015  . Chronic kidney disease (CKD), stage III (moderate) 01/11/2014  . Hypogonadism in male 07/14/2012  . BPH without obstruction/lower urinary tract symptoms 07/14/2012  . ED (erectile dysfunction) of organic origin 01/15/2012  . Insomnia 11/07/2010  . ALLERGIC RHINITIS, CHRONIC 03/23/2010  . Essential hypertension, benign 11/16/2008   Past Medical History:  Diagnosis Date  . Allergy    seasonal  . Chronic kidney disease (CKD), stage III (moderate) 01/11/2014  . Hypertension   . Hypogonadism male    Past Surgical History:  Procedure Laterality Date  . SHOULDER SURGERY      Social History   Social History  . Marital status: Married    Spouse name: N/A  . Number of children: N/A  . Years of education: N/A   Occupational History  . [employer]   Social History Main Topics  . Smoking status: Never Smoker  . Smokeless tobacco: Never Used  . Alcohol use Yes     Comment: occassionally  . Drug use: No  . Sexual activity: Not on file   Other Topics Concern  . Not on file   Social History Narrative   Regular exercise--yes   Family History  Problem Relation Age of Onset  . Colon cancer Neg Hx    No Known Allergies  Medication list has been reviewed and updated.   General: Denies fever, chills, sweats. No significant weight loss. Eyes: Denies blurring,significant itching ENT: Denies earache, sore throat, and hoarseness. Cardiovascular: Denies chest pains, palpitations, dyspnea on exertion Respiratory: Denies cough, dyspnea at rest,wheeezing Breast: no concerns about lumps GI: Denies nausea, vomiting, diarrhea, constipation, change in bowel habits, abdominal pain, melena, hematochezia GU: Denies penile discharge, ED, urinary flow / outflow problems. No STD concerns. Musculoskeletal: Denies back pain, joint pain Derm: Denies rash, itching Neuro: Denies  paresthesias, frequent falls, frequent headaches Psych: Denies depression, anxiety Endocrine: Denies cold intolerance, heat intolerance, polydipsia Heme: Denies enlarged lymph nodes Allergy: No hayfever  Objective:   BP (!) 162/100   Pulse 67   Temp 98.3 F (36.8 C) (Oral)   Ht 5' 6.25" (1.683 m)   Wt 196 lb 8 oz (89.1 kg)   BMI 31.48 kg/m  Ideal Body Weight: Weight in (lb) to have BMI = 25: 155.7  No exam data present  GEN: well developed, well nourished, no acute distress Eyes: conjunctiva and lids normal, PERRLA, EOMI ENT: TM clear, nares clear, oral exam WNL Neck: supple, no lymphadenopathy, no thyromegaly, no JVD Pulm: clear to  auscultation and percussion, respiratory effort normal CV: regular rate and rhythm, S1-S2, no murmur, rub or gallop, no bruits, peripheral pulses normal and symmetric, no cyanosis, clubbing, edema or varicosities GI: soft, non-tender; no hepatosplenomegaly, masses; active bowel sounds all quadrants GU: no hernia, testicular mass, penile discharge Lymph: no cervical, axillary or inguinal adenopathy MSK: gait normal, muscle tone and strength WNL, no joint swelling, effusions, discoloration, crepitus  SKIN: clear, good turgor, color WNL, no rashes, lesions, or ulcerations Neuro: normal mental status, normal strength, sensation, and motion Psych: alert; oriented to person, place and time, normally interactive and not anxious or depressed in appearance. All labs reviewed with patient.  Lipids:    Component Value Date/Time   CHOL 119 06/13/2016 0920   TRIG 82.0 06/13/2016 0920   HDL 53.90 06/13/2016 0920   VLDL 16.4 06/13/2016 0920   CHOLHDL 2 06/13/2016 0920   CBC: CBC Latest Ref Rng & Units 06/13/2016 02/23/2015 01/08/2014  WBC 4.0 - 10.5 K/uL 3.2(L) 4.6 4.3  Hemoglobin 13.0 - 17.0 g/dL 16.2 16.3 15.5  Hematocrit 39.0 - 52.0 % 47.4 48.6 46.6  Platelets 150.0 - 400.0 K/uL 233.0 245.0 233.0    Basic Metabolic Panel:    Component Value Date/Time   NA 139 06/13/2016 0920   K 4.8 06/13/2016 0920   CL 100 06/13/2016 0920   CO2 34 (H) 06/13/2016 0920   BUN 24 (H) 06/13/2016 0920   CREATININE 1.53 (H) 06/13/2016 0920   GLUCOSE 104 (H) 06/13/2016 0920   CALCIUM 9.6 06/13/2016 0920   Hepatic Function Latest Ref Rng & Units 06/13/2016 02/23/2015 12/22/2013  Total Protein 6.0 - 8.3 g/dL 6.9 6.6 7.2  Albumin 3.5 - 5.2 g/dL 4.5 4.2 4.1  AST 0 - 37 U/L 35 34 35  ALT 0 - 53 U/L '20 27 30  ' Alk Phosphatase 39 - 117 U/L 47 49 39  Total Bilirubin 0.2 - 1.2 mg/dL 0.6 0.4 0.7  Bilirubin, Direct 0.0 - 0.3 mg/dL 0.1 - 0.1    Lab Results  Component Value Date   TSH 3.32 01/08/2014   Lab Results   Component Value Date   PSA 4.34 (H) 06/13/2016   PSA 3.20 02/23/2015   PSA 2.62 01/08/2014    Assessment and Plan:   Healthcare maintenance  Hypogonadism in male - Plan: Testos,Total,Free and SHBG (Male), PSA  Screening PSA (prostate specific antigen) - Plan: PSA  Screening for HIV (human immunodeficiency virus) - Plan: HIV antibody  Need for hepatitis C screening test - Plan: Hepatitis C antibody  Screening, lipid - Plan: Lipid panel  Screening for diabetes mellitus - Plan: Basic metabolic panel  Encounter for long-term current use of medication - Plan: CBC with Differential/Platelet, Hepatic function panel  Health Maintenance Exam: The patient's preventative maintenance and recommended screening tests for an annual wellness exam were reviewed in full today. Brought up to date unless services declined.  Counselled on the importance of diet, exercise, and its role in overall health and mortality. The patient's FH and SH was reviewed, including their home life, tobacco status, and drug and alcohol status.  Follow-up in 1 year for physical exam or additional follow-up below.  Follow-up: No Follow-up on file.  Or follow-up in 1 year if not noted.  Meds ordered this encounter  Medications  . tadalafil (CIALIS) 5 MG tablet    Sig: Take 1 tablet (5 mg total) by mouth daily.    Dispense:  30 tablet    Refill:  5   Medications Discontinued During This Encounter  Medication Reason  . promethazine-dextromethorphan (PROMETHAZINE-DM) 6.25-15 MG/5ML syrup Completed Course  . Melatonin 10 MG TABS Completed Course  . HYDROcodone-homatropine (HYCODAN) 5-1.5 MG/5ML syrup Completed Course  . guaiFENesin-codeine (ROBITUSSIN AC) 100-10 MG/5ML syrup Completed Course  . benzonatate (TESSALON PERLES) 100 MG capsule Completed Course   Orders Placed This Encounter  Procedures  . Basic metabolic panel  . CBC with Differential/Platelet  . Hepatic function panel  . Lipid panel  .  Testos,Total,Free and SHBG (Male)  . Hepatitis C antibody  . HIV antibody  . PSA    Signed,  Frederico Hamman T. Deveney Bayon, MD   Allergies as of 06/13/2016   No Known Allergies     Medication List       Accurate as of 06/13/16  1:41 PM. Always use your most recent med list.          amitriptyline 10 MG tablet Commonly known as:  ELAVIL TAKE ONE TABLET BY MOUTH AT BEDTIME AS NEEDED   carvedilol 3.125 MG tablet Commonly known as:  COREG TAKE 1 TABLET BY MOUTH TWICE DAILY WITH A MEAL   fluticasone 50 MCG/ACT nasal spray Commonly known as:  FLONASE USE 2 SPRAYS IN EACH NOSTRIL DAILY   lisinopril 40 MG tablet Commonly known as:  PRINIVIL,ZESTRIL TAKE ONE TABLET EVERY DAY   RAPAFLO 8 MG Caps capsule Generic drug:  silodosin TAKE 1 CAPSULE BY MOUTH EVERY DAY WITH BREAKFAST (*NEED APPT)   sildenafil 20 MG tablet Commonly known as:  REVATIO Generic Revatio / Sildanefil 20 mg. 2 - 5 tabs 30 mins prior to intercourse   tadalafil 5 MG tablet Commonly known as:  CIALIS Take 1 tablet (5 mg total) by mouth daily.   terbinafine 250 MG tablet Commonly known as:  LAMISIL TAKE ONE TABLET EVERY DAY   testosterone cypionate 200 MG/ML injection Commonly known as:  DEPOTESTOSTERONE CYPIONATE INJECT 0.5MLS (100MG TOTAL) INTO THE MUSCLE ONCE A WEEK   traZODone 100 MG tablet Commonly known as:  DESYREL TAKE ONE TABLET AT BEDTIME

## 2016-06-13 NOTE — Telephone Encounter (Signed)
Received fax from [facility] requesting PA for Cialis 5 mg.  PA completed on CoverMyMeds.  Sent for review.  Can take up to 72 hours for decision.

## 2016-06-14 LAB — HEPATITIS C ANTIBODY: HCV AB: NEGATIVE

## 2016-06-14 LAB — HIV ANTIBODY (ROUTINE TESTING W REFLEX): HIV 1&2 Ab, 4th Generation: NONREACTIVE

## 2016-06-18 LAB — TESTOS,TOTAL,FREE AND SHBG (FEMALE)
Sex Hormone Binding Glob.: 30 nmol/L (ref 22–77)
TESTOSTERONE,TOTAL,LC/MS/MS: 716 ng/dL (ref 250–1100)
Testosterone, Free: 122.4 pg/mL (ref 35.0–155.0)

## 2016-06-18 NOTE — Telephone Encounter (Signed)
PA for Cialis approved from 06/13/16 through 06/13/2017.[facility]  Pharmacy notified via fax of approval.

## 2016-06-20 ENCOUNTER — Other Ambulatory Visit: Payer: Self-pay | Admitting: Family Medicine

## 2016-06-20 DIAGNOSIS — R972 Elevated prostate specific antigen [PSA]: Secondary | ICD-10-CM

## 2016-07-02 ENCOUNTER — Other Ambulatory Visit: Payer: Self-pay | Admitting: Family Medicine

## 2016-07-03 MED ORDER — CARVEDILOL 3.125 MG PO TABS: ORAL_TABLET | ORAL | 3 refills | Status: DC

## 2016-07-03 MED ORDER — SILODOSIN 8 MG PO CAPS: 8.0000 mg | ORAL_CAPSULE | Freq: Every day | ORAL | 3 refills | Status: DC

## 2016-07-03 MED ORDER — AMITRIPTYLINE HCL 10 MG PO TABS: 10.0000 mg | ORAL_TABLET | Freq: Every evening | ORAL | 3 refills | Status: DC | PRN

## 2016-07-03 NOTE — Addendum Note (Signed)
Addended by: Damita Lack on: 07/03/2016 09:38 AM   Modules accepted: Orders

## 2016-07-03 NOTE — Telephone Encounter (Signed)
Refills resent for #90 with 3.  Testosterone refills called into [facility] as instructed by Dr. Patsy Lager.

## 2016-07-03 NOTE — Telephone Encounter (Signed)
Last office visit 06/13/2016.  Last refilled 06/04/2016 for 2 ml with no refills.  Ok to refill?

## 2016-07-03 NOTE — Telephone Encounter (Signed)
Ok to refil depo-test. 2 mL, 5 refills Print and I will sign tomorrow  For all others, I just did his CPX. OK to refill  90 day supply, 3 refills  Please take off "needs appt" etc listed on all scripts, since I just saw him.   Thanks!

## 2016-07-13 NOTE — Progress Notes (Signed)
07/16/2016 1:25 PM   Max Peters 1960/07/13 [id number]  Referring provider: Hannah Beat, MD [address] [place], [place] [address]  Chief Complaint  Patient presents with  . New Patient (Initial Visit)    elevated psa referred by Kerin Perna    HPI: Patient is a 56 year old Caucasian male who is referred to [place] by his primary care physician, Dr. Karleen Hampshire Copland, for elevated PSA.  Patient was found to have a PSA of 4.34 on 06/13/2016.    PSA Trend  2.62 ng/mL two years ago  3.20 ng/mL one year ago  4.34 ng/mL on 05/24/2016  BPH WITH LUTS His IPSS score today is 5, which is mild lower urinary tract symptomatology.  He is pleased with his quality life due to his urinary symptoms.  His major complaints today are frequency and urgency.  He has had these symptoms for a few years.  He denies any dysuria, hematuria or suprapubic pain.   He currently taking silodosin 8 mg daily and tadalafil 5 mg daily.  He also denies any recent fevers, chills, nausea or vomiting.   He does not have a family history of PCa.      IPSS    Row Name 07/16/16 1400         International Prostate Symptom Score   How often have you had the sensation of not emptying your bladder? Not at All     How often have you had to urinate less than every two hours? Less than half the time     How often have you found you stopped and started again several times when you urinated? Not at All     How often have you found it difficult to postpone urination? Less than 1 in 5 times     How often have you had a weak urinary stream? Less than 1 in 5 times     How often have you had to strain to start urination? Not at All     How many times did you typically get up at night to urinate? 1 Time     Total IPSS Score 5       Quality of Life due to urinary symptoms   If you were to spend the rest of your life with your urinary condition just the way it is now how would you feel about that? Pleased         Score:  1-7 Mild 8-19 Moderate 20-35 Severe   Erectile dysfunction His SHIM score is 21, which is mild ED.   He has been having difficulty with erections for a little while.  His major complaint is achieving an erection.  His libido is preserved.   His risk factors for ED are age, BPH, hypogonadism, HTN, HLD, hypothyroidism, stress and  Antidepressants.  He denies any painful erections or curvatures with his erections.   He is still having spontaneous erections.  He has tried Cialis and sildenafil in the past with good results.       SHIM    Row Name 07/16/16 1412         SHIM: Over the last 6 months:   How do you rate your confidence that you could get and keep an erection? High     When you had erections with sexual stimulation, how often were your erections hard enough for penetration (entering your partner)? Most Times (much more than half the time)     During sexual intercourse, how  often were you able to maintain your erection after you had penetrated (entered) your partner? Almost Always or Always     During sexual intercourse, how difficult was it to maintain your erection to completion of intercourse? Not Difficult     When you attempted sexual intercourse, how often was it satisfactory for you? Sometimes (about half the time)       SHIM Total Score   SHIM 21        Score: 1-7 Severe ED 8-11 Moderate ED 12-16 Mild-Moderate ED 17-21 Mild ED 22-25 No ED  Testosterone deficiency Managed by PCP with testosterone cypionate injections.  PMH: Past Medical History:  Diagnosis Date  . Allergy    seasonal  . Chronic kidney disease (CKD), stage III (moderate) 01/11/2014  . Hypertension   . Hypogonadism male     Surgical History: Past Surgical History:  Procedure Laterality Date  . SHOULDER SURGERY      Home Medications:  Allergies as of 07/16/2016   No Known Allergies     Medication List       Accurate as of 07/16/16 11:59 PM. Always use your most recent  med list.          amitriptyline 10 MG tablet Commonly known as:  ELAVIL Take 1 tablet (10 mg total) by mouth at bedtime as needed.   carvedilol 3.125 MG tablet Commonly known as:  COREG TAKE ONE TABLET TWICE DAILY WITH A MEAL   fluticasone 50 MCG/ACT nasal spray Commonly known as:  FLONASE USE 2 SPRAYS IN EACH NOSTRIL DAILY   lisinopril 40 MG tablet Commonly known as:  PRINIVIL,ZESTRIL TAKE ONE TABLET EVERY DAY   sildenafil 20 MG tablet Commonly known as:  REVATIO Generic Revatio / Sildanefil 20 mg. 2 - 5 tabs 30 mins prior to intercourse   silodosin 8 MG Caps capsule Commonly known as:  RAPAFLO Take 1 capsule (8 mg total) by mouth daily with breakfast.   tadalafil 5 MG tablet Commonly known as:  CIALIS Take 1 tablet (5 mg total) by mouth daily.   terbinafine 250 MG tablet Commonly known as:  LAMISIL TAKE ONE TABLET EVERY DAY   testosterone cypionate 200 MG/ML injection Commonly known as:  DEPOTESTOSTERONE CYPIONATE INJECT 0.5MLS EVERY WEEK   traZODone 100 MG tablet Commonly known as:  DESYREL TAKE ONE TABLET AT BEDTIME       Allergies: No Known Allergies  Family History: Family History  Problem Relation Age of Onset  . Colon cancer Neg Hx   . Kidney cancer Neg Hx   . Prostate cancer Neg Hx   . Bladder Cancer Neg Hx     Social History:  reports that he has never smoked. He has never used smokeless tobacco. He reports that he drinks alcohol. He reports that he does not use drugs.  ROS: UROLOGY Frequent Urination?: No Hard to postpone urination?: No Burning/pain with urination?: No Get up at night to urinate?: No Leakage of urine?: No Urine stream starts and stops?: No Trouble starting stream?: No Do you have to strain to urinate?: No Blood in urine?: No Urinary tract infection?: No Sexually transmitted disease?: No Injury to kidneys or bladder?: No Painful intercourse?: No Weak stream?: No Erection problems?: Yes Penile pain?:  No  Gastrointestinal Nausea?: No Vomiting?: No Indigestion/heartburn?: No Diarrhea?: No Constipation?: No  Constitutional Fever: No Night sweats?: No Weight loss?: No Fatigue?: No  Skin Skin rash/lesions?: No Itching?: No  Eyes Blurred vision?: No Double vision?: No  Ears/Nose/Throat Sore  throat?: No Sinus problems?: No  Hematologic/Lymphatic Swollen glands?: No Easy bruising?: No  Cardiovascular Leg swelling?: Yes Chest pain?: No  Respiratory Cough?: No Shortness of breath?: No  Endocrine Excessive thirst?: No  Musculoskeletal Back pain?: No Joint pain?: No  Neurological Headaches?: No Dizziness?: No  Psychologic Depression?: No Anxiety?: Yes  Physical Exam: BP (!) 174/91   Pulse 60   Ht 5' 6.75" (1.695 m)   Wt 199 lb 8 oz (90.5 kg)   BMI 31.48 kg/m   Constitutional: Well nourished. Alert and oriented, No acute distress. HEENT: [place] AT, moist mucus membranes. Trachea midline, no masses. Cardiovascular: No clubbing, cyanosis, or edema. Respiratory: Normal respiratory effort, no increased work of breathing. GI: Abdomen is soft, non tender, non distended, no abdominal masses. Liver and spleen not palpable.  No hernias appreciated.  Stool sample for occult testing is not indicated.   GU: No CVA tenderness.  No bladder fullness or masses.  Patient with circumcised phallus.  Urethral meatus is patent.  No penile discharge. No penile lesions or rashes. Scrotum without lesions, cysts, rashes and/or edema.  Testicles are located scrotally bilaterally. No masses are appreciated in the testicles. Left and right epididymis are normal. Rectal: Patient with  normal sphincter tone. Anus and perineum without scarring or rashes. No rectal masses are appreciated. Prostate is approximately 50 grams, no nodules are appreciated. Seminal vesicles are normal. Skin: No rashes, bruises or suspicious lesions. Lymph: No cervical or inguinal adenopathy. Neurologic: Grossly  intact, no focal deficits, moving all 4 extremities. Psychiatric: Normal mood and affect.  Laboratory Data: Lab Results  Component Value Date   WBC 3.2 (L) 06/13/2016   HGB 16.2 06/13/2016   HCT 47.4 06/13/2016   MCV 95.6 06/13/2016   PLT 233.0 06/13/2016    Lab Results  Component Value Date   CREATININE 1.53 (H) 06/13/2016    Lab Results  Component Value Date   PSA 4.34 (H) 06/13/2016   PSA 3.20 02/23/2015   PSA 2.62 01/08/2014    Lab Results  Component Value Date   TESTOSTERONE 1710.14 (H) 12/10/2008     Lab Results  Component Value Date   TSH 3.32 01/08/2014       Component Value Date/Time   CHOL 119 06/13/2016 0920   HDL 53.90 06/13/2016 0920   CHOLHDL 2 06/13/2016 0920   VLDL 16.4 06/13/2016 0920   LDLCALC 48 06/13/2016 0920    Lab Results  Component Value Date   AST 35 06/13/2016   Lab Results  Component Value Date   ALT 20 06/13/2016    Assessment & Plan:    1. Elevated PSA  - I discussed with the patient that PSA is an acronym for  prostate specific antigen,  which is a protein made by the prostate gland and can be detected in the blood stream. I explained to the patient situations that would increase the PSA, such as: a man's age,  BPH, infection, recent intercourse/ejaculation, prostate infarction, recent urethroscopic manipulation (Foley placement/cystoscopy) and prostate cancer.   - We discussed that indications for prostate biopsy are defined by age and race specific PSA cutoffs as well as a PSA velocity of 0.75/year.  - At this time, I have advised the patient that we will repeat the PSA to rule out lab error.  If that should return elevated, we could continue observation or pursue a prostate biopsy.              - He will RTC pending PSA results  2. BPH with LUTS  - IPSS score is 5/1  - Continue conservative management, avoiding bladder irritants and timed voiding's  - most bothersome symptoms is/are frequency  - Continue Rapaflo 8 mg  daily, managed by PCP  3. Erectile dysfunction  - SHIM score is 21  - Continue Cialis, managed by PCP   4. Testosterone deficiency  - Advised the patient discontinue testosterone replacement therapy at this time until his workup for elevated PSA is complete and it is deemed safe for him to continue testosterone therapy, explained to patient that if he does have prostate cancer, testosterone can stimulate further growth and spread of the cancer, patient voices his understanding.    Return for Pending PSA results.  These notes generated with voice recognition software. I apologize for typographical errors.  Michiel Cowboy, PA-C  [facility] [address] [place], [place] [address] [phone]

## 2016-07-16 ENCOUNTER — Ambulatory Visit (INDEPENDENT_AMBULATORY_CARE_PROVIDER_SITE_OTHER): Payer: 59 | Admitting: Urology

## 2016-07-16 ENCOUNTER — Encounter: Payer: Self-pay | Admitting: Urology

## 2016-07-16 VITALS — BP 174/91 | HR 60 | Ht 66.75 in | Wt 199.5 lb

## 2016-07-16 DIAGNOSIS — E349 Endocrine disorder, unspecified: Secondary | ICD-10-CM

## 2016-07-16 DIAGNOSIS — N529 Male erectile dysfunction, unspecified: Secondary | ICD-10-CM

## 2016-07-16 DIAGNOSIS — R972 Elevated prostate specific antigen [PSA]: Secondary | ICD-10-CM

## 2016-07-16 DIAGNOSIS — N401 Enlarged prostate with lower urinary tract symptoms: Secondary | ICD-10-CM | POA: Diagnosis not present

## 2016-07-16 DIAGNOSIS — N138 Other obstructive and reflux uropathy: Secondary | ICD-10-CM

## 2016-07-17 ENCOUNTER — Telehealth: Payer: Self-pay

## 2016-07-17 LAB — PSA: Prostate Specific Ag, Serum: 4.1 ng/mL — ABNORMAL HIGH (ref 0.0–4.0)

## 2016-07-17 NOTE — Telephone Encounter (Signed)
-----   Message from Harle Battiest, PA-C sent at 07/17/2016  8:15 AM EDT ----- Would you please add a free and total PSA to his blood work?

## 2016-07-17 NOTE — Telephone Encounter (Signed)
Labs were added to pt blood work.

## 2016-07-18 ENCOUNTER — Telehealth: Payer: Self-pay

## 2016-07-18 NOTE — Telephone Encounter (Signed)
Spoke with pt in reference to PSA and probability of having prostate cancer. Made pt aware Max Peters highly suggest a prostate bx at this time. Pt stated that he will think about it and call back.

## 2016-07-18 NOTE — Telephone Encounter (Signed)
-----   Message from Harle Battiest, PA-C sent at 07/18/2016  8:07 AM EDT ----- Please let Max Peters no that his PSA remains elevated. The additional blood work notes a 56% probability of having prostate cancer at this time. I would highly suggest undergoing prostate biopsy at this time, especially if he would like to continue testosterone therapy to rule out prostate cancer.

## 2016-07-19 LAB — PSA, TOTAL AND FREE
PSA FREE PCT: 9.8 %
PSA, Free: 0.4 ng/mL
Prostate Specific Ag, Serum: 4.1 ng/mL — ABNORMAL HIGH (ref 0.0–4.0)

## 2016-07-19 LAB — SPECIMEN STATUS REPORT

## 2016-07-20 ENCOUNTER — Telehealth: Payer: Self-pay

## 2016-07-20 ENCOUNTER — Encounter: Payer: Self-pay | Admitting: Urology

## 2016-07-20 ENCOUNTER — Encounter: Payer: Self-pay | Admitting: Family Medicine

## 2016-07-20 DIAGNOSIS — R972 Elevated prostate specific antigen [PSA]: Secondary | ICD-10-CM

## 2016-07-20 NOTE — Telephone Encounter (Signed)
Spoke with pt in reference to PSA levels. Reinforced with pt Carollee HerterShannon highly recommends him to have a prostate bx at this time. Pt stated "I am not going to do that because my PSA was only 4.1. This is ridiculous. I will have my labs drawn again though." Made pt aware Carollee HerterShannon would like a PSA 5mo after stopping testosterone. Pt stated that he is currently not on testosterone therefore a lab appt was made for 08/20/16. Orders placed.

## 2016-07-20 NOTE — Telephone Encounter (Signed)
Pt walked in requesting PSA results. Results were printed and given. Pt then stated that he has done some research and would prefer to stop testosterone injections at this time. Pt then stated that if his PSA continues to rise then he will pursue a bx. Please advise.

## 2016-07-20 NOTE — Telephone Encounter (Signed)
I do HIGHLY recommend a biopsy at this time, but if he wants to stop his testosterone therapy instead, I suggest rechecking the PSA in one month.

## 2016-08-20 ENCOUNTER — Other Ambulatory Visit: Payer: 59

## 2016-08-20 DIAGNOSIS — R972 Elevated prostate specific antigen [PSA]: Secondary | ICD-10-CM

## 2016-08-21 ENCOUNTER — Telehealth: Payer: Self-pay

## 2016-08-21 LAB — PSA: PROSTATE SPECIFIC AG, SERUM: 4.5 ng/mL — AB (ref 0.0–4.0)

## 2016-08-21 NOTE — Telephone Encounter (Signed)
-----   Message from [name], PA-C sent at 08/21/2016  7:48 AM EDT ----- Please let Mr. Max Peters know that his PSA has increased a little from his blood draw one month ago.  Please add the free and total PSA to his blood work.

## 2016-08-21 NOTE — Telephone Encounter (Signed)
Called patient. Gave lab results. Patient verbalized understanding. Called [facility] added free and total PSA to bloodwork.

## 2016-08-22 ENCOUNTER — Telehealth: Payer: Self-pay

## 2016-08-22 DIAGNOSIS — N400 Enlarged prostate without lower urinary tract symptoms: Secondary | ICD-10-CM

## 2016-08-22 NOTE — Telephone Encounter (Signed)
-----   Message from [name], PA-C sent at 08/22/2016  8:26 AM EDT ----- Please tell Mr. Max Peters that his blood work now demonstrates a 24% probability of having prostate cancer.  I still recommend he undergo a biopsy at this time if he wants to continue testosterone therapy.  If he is going to discontinue he testosterone therapy, I do recommend he have a repeat PSA in 3 months.

## 2016-08-22 NOTE — Telephone Encounter (Signed)
Called patient. Gave lab results. Patient verbalized understanding. Scheduled 3 month lab visit. Placed order.

## 2016-08-23 ENCOUNTER — Telehealth: Payer: Self-pay

## 2016-08-23 ENCOUNTER — Other Ambulatory Visit: Payer: Self-pay | Admitting: Family Medicine

## 2016-08-23 NOTE — Telephone Encounter (Signed)
-----   Message from Shannon A McGowan, PA-C sent at 08/22/2016  8:26 AM EDT ----- Please tell Mr. Max Peters that his blood work now demonstrates a 24% probability of having prostate cancer.  I still recommend he undergo a biopsy at this time if he wants to continue testosterone therapy.  If he is going to discontinue he testosterone therapy, I do recommend he have a repeat PSA in 3 months. 

## 2016-08-23 NOTE — Telephone Encounter (Signed)
Duplicate mess pt already notified

## 2016-09-12 LAB — SPECIMEN STATUS REPORT

## 2016-09-12 LAB — PSA, TOTAL AND FREE
PSA FREE PCT: 10.2 %
PSA FREE: 0.46 ng/mL
Prostate Specific Ag, Serum: 4.5 ng/mL — ABNORMAL HIGH (ref 0.0–4.0)

## 2016-10-03 ENCOUNTER — Telehealth: Payer: Self-pay | Admitting: *Deleted

## 2016-10-03 NOTE — Telephone Encounter (Signed)
Received fax from [facility] requested PA for Rapaflo 8 mg.  PA completed on CoverMyMeds.  Sent for review and can take up to 3 business days to get a decision.

## 2016-10-05 NOTE — Telephone Encounter (Signed)
Please call the patient.  Let him know this medication was denied by his insurance.  I would recommend that he try Flomax 4 mg a day.  This is similar to the prior medication, but older, generic and cheaper.  flomax 4 mg, 1 po daily, #90, 3 ref If he is so inclined

## 2016-10-05 NOTE — Telephone Encounter (Signed)
Medication denial received stating he has to try and fail 2 of the formulary alternatives: doxazosin, tamsulosin, alfuzosin, etc. Appeal info in Dr Cyndie Chimeopland's Inbox on his desk if needed. Pt is not aware.

## 2016-10-08 ENCOUNTER — Telehealth: Payer: Self-pay | Admitting: *Deleted

## 2016-10-08 MED ORDER — TAMSULOSIN HCL 0.4 MG PO CAPS: 0.4000 mg | ORAL_CAPSULE | Freq: Every day | ORAL | 3 refills | Status: DC

## 2016-10-08 NOTE — Telephone Encounter (Signed)
Left message for Mr. Max Peters to return my call.

## 2016-10-08 NOTE — Addendum Note (Signed)
Addended by: Damita LackLORING, Kineta Fudala S on: 10/08/2016 10:51 AM   Modules accepted: Orders

## 2016-10-08 NOTE — Telephone Encounter (Signed)
Spoke to pharmacist who states there is a drug interaction between pts cialis and flomax. Pharmacy is requesting a call back to confirm ok to refill both as directed. pls advise

## 2016-10-08 NOTE — Telephone Encounter (Addendum)
Mr. Max Peters stopped by office.  PA denial for Rapaflo discussed and Mr. Max Peters is agreeable to trying the tamsulosin.  New Rx for tamsulosin 0.4 mg sent into [facility]. Medication list updated.

## 2016-10-08 NOTE — Telephone Encounter (Signed)
Called pt and advise of interaction and advised him of Dr. Royden Purlower's comments. Also called pharmacy and advise them of Dr. Royden Purlower's comments too

## 2016-10-08 NOTE — Telephone Encounter (Signed)
It looks like the possible interaction can cause hypotension  Tell pt to use caution with this  Looks like his bp has been nl to high  Will cc his pcp

## 2016-11-15 ENCOUNTER — Other Ambulatory Visit: Payer: Self-pay | Admitting: Family Medicine

## 2016-11-21 ENCOUNTER — Other Ambulatory Visit: Payer: BLUE CROSS/BLUE SHIELD

## 2016-11-21 DIAGNOSIS — N400 Enlarged prostate without lower urinary tract symptoms: Secondary | ICD-10-CM

## 2016-11-22 LAB — PSA: Prostate Specific Ag, Serum: 2.9 ng/mL (ref 0.0–4.0)

## 2016-11-28 ENCOUNTER — Telehealth: Payer: Self-pay

## 2016-11-28 DIAGNOSIS — R972 Elevated prostate specific antigen [PSA]: Secondary | ICD-10-CM

## 2016-11-28 NOTE — Telephone Encounter (Signed)
Patient notified , he would like to know can he restart his testosterone and if so he will need a script and when should he follow up and does he need blood work?

## 2016-11-28 NOTE — Telephone Encounter (Signed)
-----   Message from Jerilee FieldMatthew Eskridge, MD sent at 11/27/2016  4:50 PM EDT ----- Good news - PSA lower, normal. Give result. F/u as planned.   ----- Message ----- From: Ellin GoodieLowe, Casandra S, CMA Sent: 11/22/2016   2:29 PM To: Jerilee FieldMatthew Eskridge, MD    ----- Message ----- From: Nell RangeInterface, [facility] Lab Results In Sent: 11/22/2016   5:41 AM To: Ellin Goodieasandra S Lowe, CMA

## 2016-12-04 MED ORDER — TESTOSTERONE CYPIONATE 200 MG/ML IM SOLN: 100.0000 mg | INTRAMUSCULAR | 0 refills | Status: DC

## 2016-12-04 NOTE — Telephone Encounter (Signed)
Script [facility]ed to [facility] per shannon blood draw after 4th injection and to see her for 97month follow up in October.   Patient states that he will continue care with Dr. Dallas Schimke to have his testosterone injections done but will follow up with a 97month to recheck PSA  Script was cancelled at [facility]

## 2016-12-29 ENCOUNTER — Other Ambulatory Visit: Payer: Self-pay | Admitting: Family Medicine

## 2017-02-15 ENCOUNTER — Other Ambulatory Visit: Payer: Self-pay | Admitting: Family Medicine

## 2017-03-21 ENCOUNTER — Other Ambulatory Visit: Payer: Self-pay | Admitting: Family Medicine

## 2017-04-05 ENCOUNTER — Other Ambulatory Visit: Payer: Self-pay | Admitting: Family Medicine

## 2017-04-05 NOTE — Telephone Encounter (Signed)
Last office visit 06/13/2016.  Last refilled 12/31/2016 for #90 with no refills.  Ok to refill?

## 2017-04-09 ENCOUNTER — Other Ambulatory Visit: Payer: Self-pay | Admitting: Family Medicine

## 2017-04-09 NOTE — Telephone Encounter (Signed)
Last office visit 06/13/2016.  Not on current medication list.  Refill?

## 2017-05-04 ENCOUNTER — Other Ambulatory Visit: Payer: Self-pay | Admitting: Family Medicine

## 2017-06-03 ENCOUNTER — Other Ambulatory Visit: Payer: BLUE CROSS/BLUE SHIELD

## 2017-06-06 ENCOUNTER — Ambulatory Visit: Payer: BLUE CROSS/BLUE SHIELD | Admitting: Urology

## 2017-06-08 ENCOUNTER — Other Ambulatory Visit: Payer: Self-pay | Admitting: Family Medicine

## 2017-06-13 ENCOUNTER — Ambulatory Visit (INDEPENDENT_AMBULATORY_CARE_PROVIDER_SITE_OTHER): Payer: BLUE CROSS/BLUE SHIELD | Admitting: Family Medicine

## 2017-06-13 ENCOUNTER — Encounter: Payer: Self-pay | Admitting: *Deleted

## 2017-06-13 ENCOUNTER — Encounter: Payer: Self-pay | Admitting: Family Medicine

## 2017-06-13 VITALS — BP 126/98 | HR 77 | Temp 97.6°F | Ht 66.25 in | Wt 198.8 lb

## 2017-06-13 DIAGNOSIS — R972 Elevated prostate specific antigen [PSA]: Secondary | ICD-10-CM | POA: Diagnosis not present

## 2017-06-13 DIAGNOSIS — Z0000 Encounter for general adult medical examination without abnormal findings: Secondary | ICD-10-CM | POA: Diagnosis not present

## 2017-06-13 DIAGNOSIS — E291 Testicular hypofunction: Secondary | ICD-10-CM | POA: Diagnosis not present

## 2017-06-13 LAB — CBC WITH DIFFERENTIAL/PLATELET
BASOS ABS: 0 10*3/uL (ref 0.0–0.1)
Basophils Relative: 0.7 % (ref 0.0–3.0)
EOS ABS: 0.2 10*3/uL (ref 0.0–0.7)
Eosinophils Relative: 6.4 % — ABNORMAL HIGH (ref 0.0–5.0)
HCT: 49.7 % (ref 39.0–52.0)
Hemoglobin: 16.8 g/dL (ref 13.0–17.0)
LYMPHS PCT: 31.6 % (ref 12.0–46.0)
Lymphs Abs: 1.2 10*3/uL (ref 0.7–4.0)
MCHC: 33.9 g/dL (ref 30.0–36.0)
MCV: 97.1 fl (ref 78.0–100.0)
MONOS PCT: 15.2 % — AB (ref 3.0–12.0)
Monocytes Absolute: 0.6 10*3/uL (ref 0.1–1.0)
NEUTROS PCT: 46.1 % (ref 43.0–77.0)
Neutro Abs: 1.7 10*3/uL (ref 1.4–7.7)
Platelets: 230 10*3/uL (ref 150.0–400.0)
RBC: 5.12 Mil/uL (ref 4.22–5.81)
RDW: 14.3 % (ref 11.5–15.5)
WBC: 3.7 10*3/uL — ABNORMAL LOW (ref 4.0–10.5)

## 2017-06-13 LAB — BASIC METABOLIC PANEL
BUN: 22 mg/dL (ref 6–23)
CALCIUM: 9.2 mg/dL (ref 8.4–10.5)
CO2: 33 mEq/L — ABNORMAL HIGH (ref 19–32)
Chloride: 102 mEq/L (ref 96–112)
Creatinine, Ser: 1.31 mg/dL (ref 0.40–1.50)
GFR: 59.86 mL/min — AB (ref >60.00)
Glucose, Bld: 98 mg/dL (ref 70–99)
Potassium: 4.7 mEq/L (ref 3.5–5.1)
SODIUM: 140 meq/L (ref 135–145)

## 2017-06-13 LAB — HEPATIC FUNCTION PANEL
ALBUMIN: 4.4 g/dL (ref 3.5–5.2)
ALT: 22 U/L (ref 0–53)
AST: 25 U/L (ref 0–37)
Alkaline Phosphatase: 50 U/L (ref 39–117)
Bilirubin, Direct: 0.1 mg/dL (ref 0.0–0.3)
TOTAL PROTEIN: 7.6 g/dL (ref 6.0–8.3)
Total Bilirubin: 0.5 mg/dL (ref 0.2–1.2)

## 2017-06-13 LAB — LIPID PANEL
Cholesterol: 107 mg/dL (ref 0–200)
HDL: 54.8 mg/dL (ref >39.00)
LDL Cholesterol: 42 mg/dL (ref 0–99)
NONHDL: 52.61
TRIGLYCERIDES: 52 mg/dL (ref 0.0–149.0)
Total CHOL/HDL Ratio: 2
VLDL: 10.4 mg/dL (ref 0.0–40.0)

## 2017-06-13 NOTE — Progress Notes (Signed)
Dr. Frederico Hamman T. Khoen Genet, MD, [facility] [place], [address] Phone: [phone] Fax: [phone]  06/13/2017  Patient: Max Peters, MRN: [id number], DOB: [birth year], 57 y.o.  Primary Physician:  Owens Loffler, MD   Chief Complaint  Patient presents with  . Annual Exam   Subjective:   Max Peters is a 57 y.o. pleasant patient who presents with the following:  Preventative Health Maintenance Visit:  Health Maintenance Summary Reviewed and updated, unless pt declines services.  Tobacco History Reviewed. Alcohol: No concerns, no excessive use Exercise Habits: 3 times a week, rec at least 30 mins 5 times a week STD concerns: no risk or activity to increase risk Drug Use: None Encouraged self-testicular check  Has stopped test, had a PSA about 4.3, ? Consider bx, but holding for now  Health Maintenance  Topic Date Due  . INFLUENZA VACCINE  10/17/2016  . COLONOSCOPY  06/02/2023  . TETANUS/TDAP  03/15/2025  . Hepatitis C Screening  Completed  . HIV Screening  Completed   Immunization History  Administered Date(s) Administered  . Influenza,inj,Quad PF,6+ Mos 01/07/2013, 03/16/2015  . Influenza-Unspecified 01/08/2014  . Tdap 03/16/2015   Patient Active Problem List   Diagnosis Date Noted  . Leg edema, left 06/08/2015  . Chronic kidney disease (CKD), stage III (moderate) ([place]) 01/11/2014  . Hypogonadism in male 07/14/2012  . BPH without obstruction/lower urinary tract symptoms 07/14/2012  . ED (erectile dysfunction) of organic origin 01/15/2012  . Insomnia 11/07/2010  . ALLERGIC RHINITIS, CHRONIC 03/23/2010  . Essential hypertension, benign 11/16/2008   Past Medical History:  Diagnosis Date  . Allergy    seasonal  . Chronic kidney disease (CKD), stage III (moderate) ([place]) 01/11/2014  . Hypertension   . Hypogonadism male    Past Surgical History:  Procedure Laterality Date  . SHOULDER SURGERY       Social History   Socioeconomic History  . Marital status: Married    Spouse name: Not on file  . Number of children: Not on file  . Years of education: Not on file  . Highest education level: Not on file  Occupational History  . Occupation: [employer]    Employer: Teacher, music  Social Needs  . Financial resource strain: Not on file  . Food insecurity:    Worry: Not on file    Inability: Not on file  . Transportation needs:    Medical: Not on file    Non-medical: Not on file  Tobacco Use  . Smoking status: Never Smoker  . Smokeless tobacco: Never Used  Substance and Sexual Activity  . Alcohol use: Yes    Comment: occassionally  . Drug use: No  . Sexual activity: Not on file  Lifestyle  . Physical activity:    Days per week: Not on file    Minutes per session: Not on file  . Stress: Not on file  Relationships  . Social connections:    Talks on phone: Not on file    Gets together: Not on file    Attends religious service: Not on file    Active member of club or organization: Not on file    Attends meetings of clubs or organizations: Not on file    Relationship status: Not on file  . Intimate partner violence:    Fear of current or ex partner: Not on file    Emotionally abused: Not on file    Physically  abused: Not on file    Forced sexual activity: Not on file  Other Topics Concern  . Not on file  Social History Narrative   Regular exercise--yes   Family History  Problem Relation Age of Onset  . Colon cancer Neg Hx   . Kidney cancer Neg Hx   . Prostate cancer Neg Hx   . Bladder Cancer Neg Hx    No Known Allergies  Medication list has been reviewed and updated.   General: Denies fever, chills, sweats. No significant weight loss. Eyes: Denies blurring,significant itching ENT: Denies earache, sore throat, and hoarseness. Cardiovascular: Denies chest pains, palpitations, dyspnea on exertion Respiratory: Denies cough, dyspnea at  rest,wheeezing Breast: no concerns about lumps GI: Denies nausea, vomiting, diarrhea, constipation, change in bowel habits, abdominal pain, melena, hematochezia GU: Denies penile discharge, ED, urinary flow / outflow problems. No STD concerns. Musculoskeletal: Denies back pain, joint pain Derm: Denies rash, itching Neuro: Denies  paresthesias, frequent falls, frequent headaches Psych: Denies depression, anxiety Endocrine: Denies cold intolerance, heat intolerance, polydipsia Heme: Denies enlarged lymph nodes Allergy: No hayfever  Objective:   BP (!) 126/98 (BP Location: Left Arm, Patient Position: Sitting, Cuff Size: Large)   Pulse 77   Temp 97.6 F (36.4 C) (Oral)   Ht 5' 6.25" (1.683 m)   Wt 198 lb 12 oz (90.2 kg)   SpO2 97%   BMI 31.84 kg/m  Ideal Body Weight: Weight in (lb) to have BMI = 25: 155.7  No exam data present  GEN: well developed, well nourished, no acute distress Eyes: conjunctiva and lids normal, PERRLA, EOMI ENT: TM clear, nares clear, oral exam WNL Neck: supple, no lymphadenopathy, no thyromegaly, no JVD Pulm: clear to auscultation and percussion, respiratory effort normal CV: regular rate and rhythm, S1-S2, no murmur, rub or gallop, no bruits, peripheral pulses normal and symmetric, no cyanosis, clubbing, edema or varicosities GI: soft, non-tender; no hepatosplenomegaly, masses; active bowel sounds all quadrants GU: no hernia, testicular mass, penile discharge Lymph: no cervical, axillary or inguinal adenopathy MSK: gait normal, muscle tone and strength WNL, no joint swelling, effusions, discoloration, crepitus  SKIN: clear, good turgor, color WNL, no rashes, lesions, or ulcerations Neuro: normal mental status, normal strength, sensation, and motion Psych: alert; oriented to person, place and time, normally interactive and not anxious or depressed in appearance. All labs reviewed with patient.  Lipids:    Component Value Date/Time   CHOL 119 06/13/2016  0920   TRIG 82.0 06/13/2016 0920   HDL 53.90 06/13/2016 0920   VLDL 16.4 06/13/2016 0920   CHOLHDL 2 06/13/2016 0920   CBC: CBC Latest Ref Rng & Units 06/13/2016 02/23/2015 01/08/2014  WBC 4.0 - 10.5 K/uL 3.2(L) 4.6 4.3  Hemoglobin 13.0 - 17.0 g/dL 16.2 16.3 15.5  Hematocrit 39.0 - 52.0 % 47.4 48.6 46.6  Platelets 150.0 - 400.0 K/uL 233.0 245.0 761.6    Basic Metabolic Panel:    Component Value Date/Time   NA 139 06/13/2016 0920   K 4.8 06/13/2016 0920   CL 100 06/13/2016 0920   CO2 34 (H) 06/13/2016 0920   BUN 24 (H) 06/13/2016 0920   CREATININE 1.53 (H) 06/13/2016 0920   GLUCOSE 104 (H) 06/13/2016 0920   CALCIUM 9.6 06/13/2016 0920   Hepatic Function Latest Ref Rng & Units 06/13/2016 02/23/2015 12/22/2013  Total Protein 6.0 - 8.3 g/dL 6.9 6.6 7.2  Albumin 3.5 - 5.2 g/dL 4.5 4.2 4.1  AST 0 - 37 U/L 35 34 35  ALT 0 -  53 U/L '20 27 30  ' Alk Phosphatase 39 - 117 U/L 47 49 39  Total Bilirubin 0.2 - 1.2 mg/dL 0.6 0.4 0.7  Bilirubin, Direct 0.0 - 0.3 mg/dL 0.1 - 0.1    Lab Results  Component Value Date   TSH 3.32 01/08/2014   Lab Results  Component Value Date   PSA 4.34 (H) 06/13/2016   PSA 3.20 02/23/2015   PSA 2.62 01/08/2014    Assessment and Plan:   Healthcare maintenance - Plan: Basic metabolic panel, CBC with Differential/Platelet, Lipid panel, Hepatic function panel  Hypogonadism in male - Plan: Testos,Total,Free and SHBG (Male)  Elevated PSA - Plan: PSA, total and free  Health Maintenance Exam: The patient's preventative maintenance and recommended screening tests for an annual wellness exam were reviewed in full today. Brought up to date unless services declined.  Counselled on the importance of diet, exercise, and its role in overall health and mortality. The patient's FH and SH was reviewed, including their home life, tobacco status, and drug and alcohol status.  Follow-up in 1 year for physical exam or additional follow-up below.  Follow-up: No  follow-ups on file. Or follow-up in 1 year if not noted.  Orders Placed This Encounter  Procedures  . Basic metabolic panel  . CBC with Differential/Platelet  . Lipid panel  . Testos,Total,Free and SHBG (Male)  . Hepatic function panel  . PSA, total and free    Signed,  Moses Odoherty T. Raidyn Breiner, MD   Allergies as of 06/13/2017   No Known Allergies     Medication List        Accurate as of 06/13/17 10:09 AM. Always use your most recent med list.          amitriptyline 10 MG tablet Commonly known as:  ELAVIL TAKE ONE TABLET BY MOUTH AT BEDTIME AS NEEDED   azelastine 0.1 % nasal spray Commonly known as:  ASTELIN SPRAY INTO EACH NOSTRIL TWICE DAILY   carvedilol 3.125 MG tablet Commonly known as:  COREG TAKE ONE TABLET TWICE DAILY WITH A MEAL   CIALIS 5 MG tablet Generic drug:  tadalafil TAKE ONE TABLET EVERY DAY   lisinopril 40 MG tablet Commonly known as:  PRINIVIL,ZESTRIL TAKE ONE TABLET EVERY DAY   sildenafil 20 MG tablet Commonly known as:  REVATIO TAKE 2 TO 5 TABLETS DAILY AS NEEDED   tamsulosin 0.4 MG Caps capsule Commonly known as:  FLOMAX Take 1 capsule (0.4 mg total) by mouth daily.   testosterone cypionate 200 MG/ML injection Commonly known as:  DEPOTESTOSTERONE CYPIONATE Inject 0.5 mLs (100 mg total) into the muscle once a week.   traZODone 100 MG tablet Commonly known as:  DESYREL TAKE ONE TABLET AT BEDTIME

## 2017-06-16 LAB — PSA, TOTAL AND FREE
PSA, % FREE: 9 % — AB (ref >25)
PSA, Free: 0.4 ng/mL
PSA, TOTAL: 4.3 ng/mL — AB (ref <=4.0)

## 2017-06-16 LAB — TESTOS,TOTAL,FREE AND SHBG (FEMALE)
FREE TESTOSTERONE: 52.3 pg/mL (ref 35.0–155.0)
SEX HORMONE BINDING: 40 nmol/L (ref 22–77)
Testosterone, Total, LC-MS-MS: 368 ng/dL (ref 250–1100)

## 2017-07-05 ENCOUNTER — Other Ambulatory Visit: Payer: Self-pay | Admitting: Family Medicine

## 2017-07-22 IMAGING — US US EXTREM LOW VENOUS*L*
1 series · 13 of 24 positions shown · non-contrast
Comparison: None.

CLINICAL DATA: Left leg swelling for 3 weeks as a result of the
hamstring injury.



[Series 1: us extrem low venous*left* · 0.07mm/px · 13 of 34 slices shown]
[im 1/34]
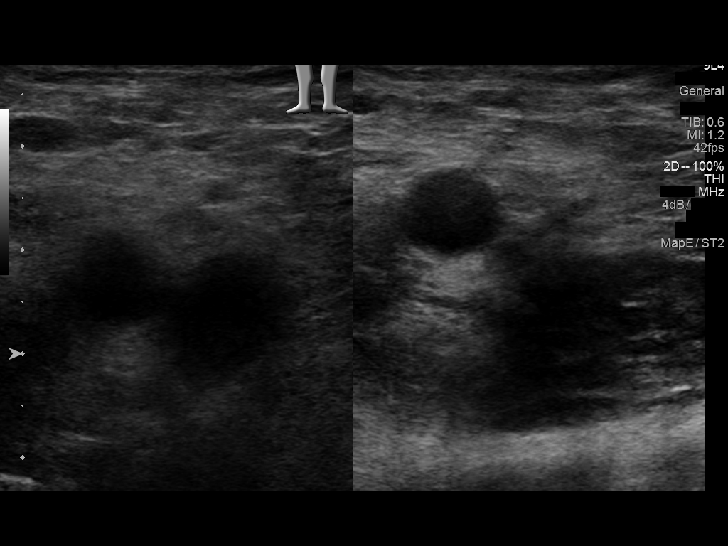
[im 3/34]
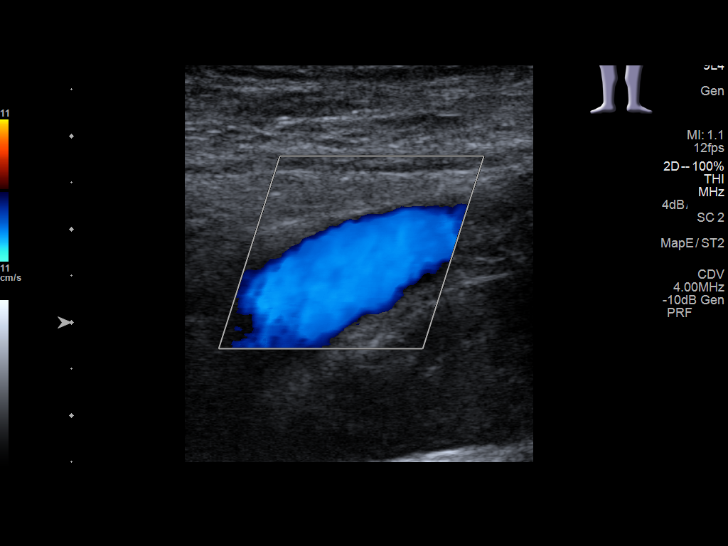
[im 6/34]
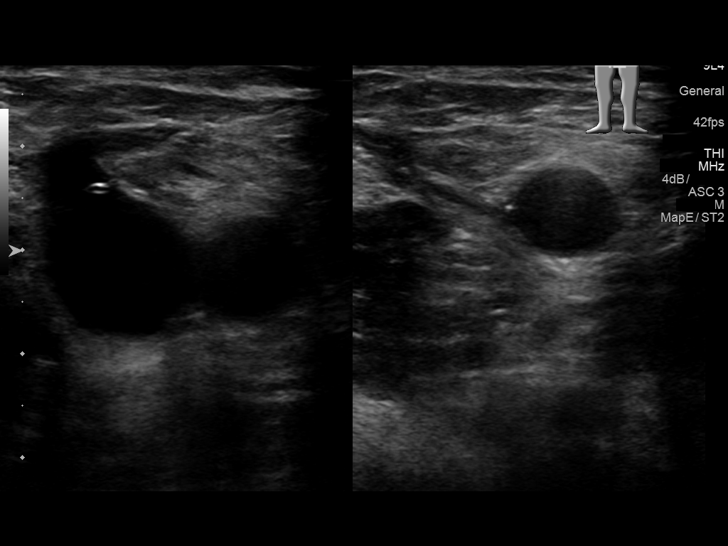
[im 9/34]
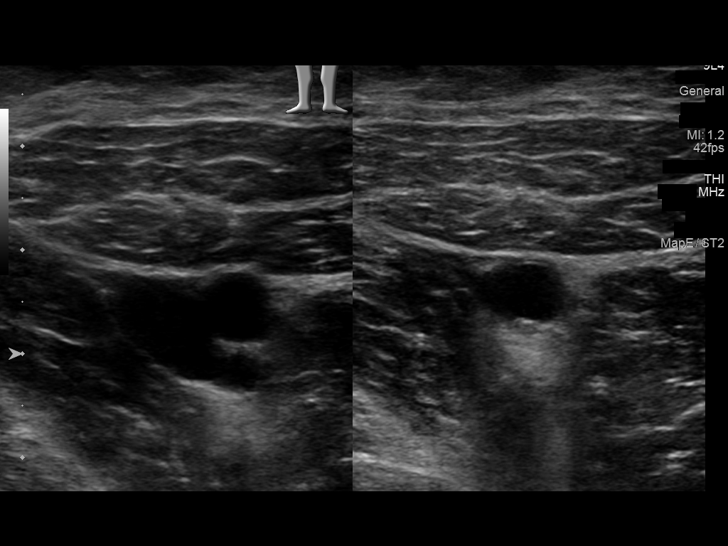
[im 12/34]
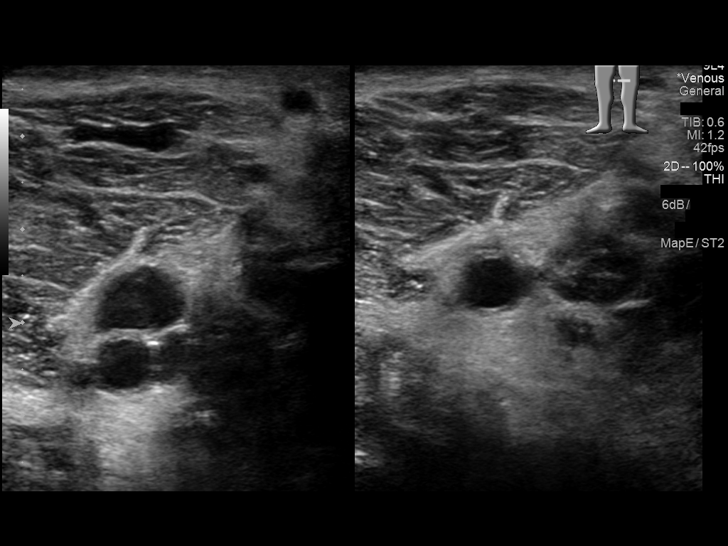
[im 15/34]
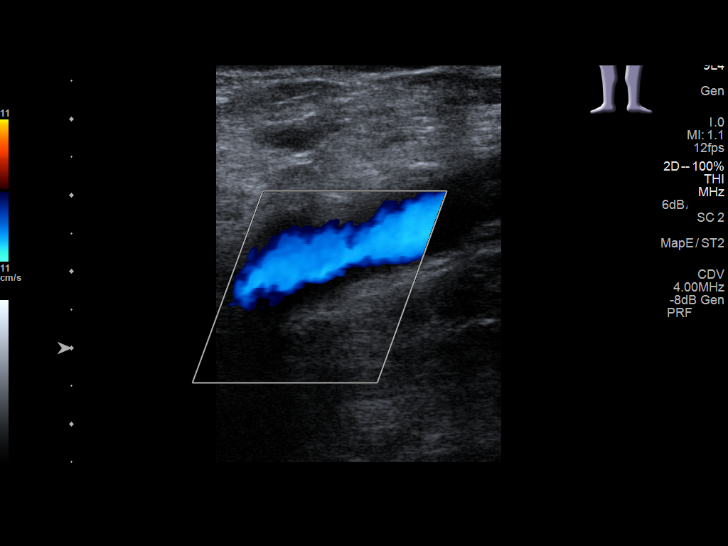
[im 18/34]
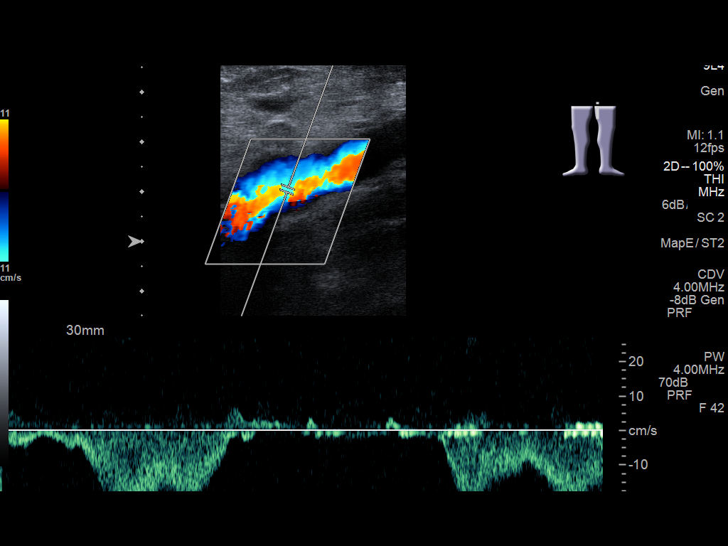
[im 19/34]
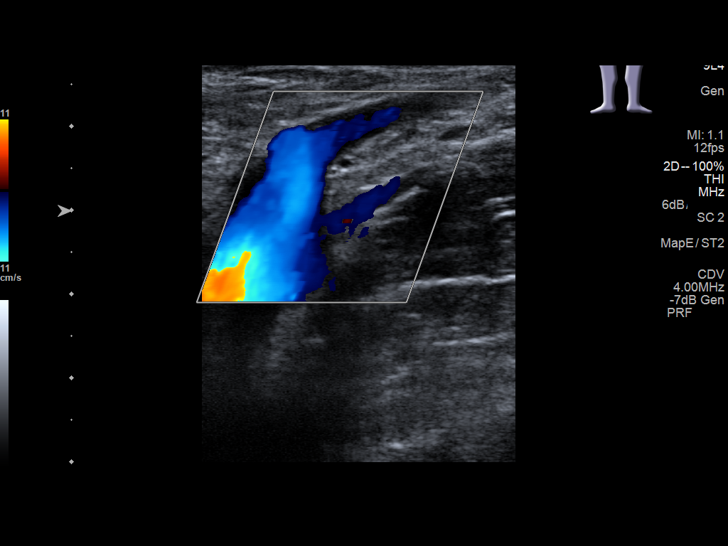
[im 22/34]
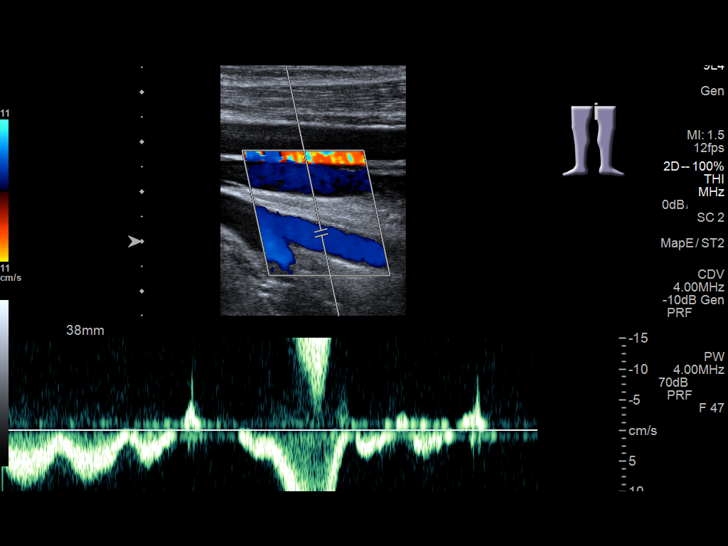
[im 25/34]
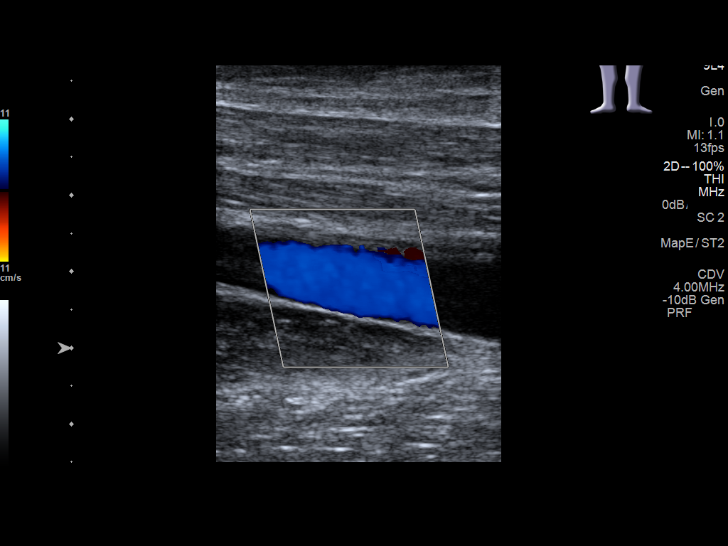
[im 28/34]
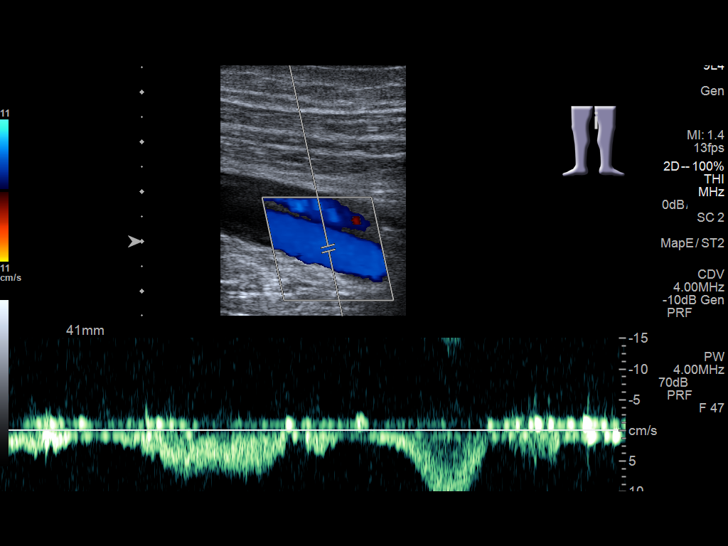
[im 31/34]
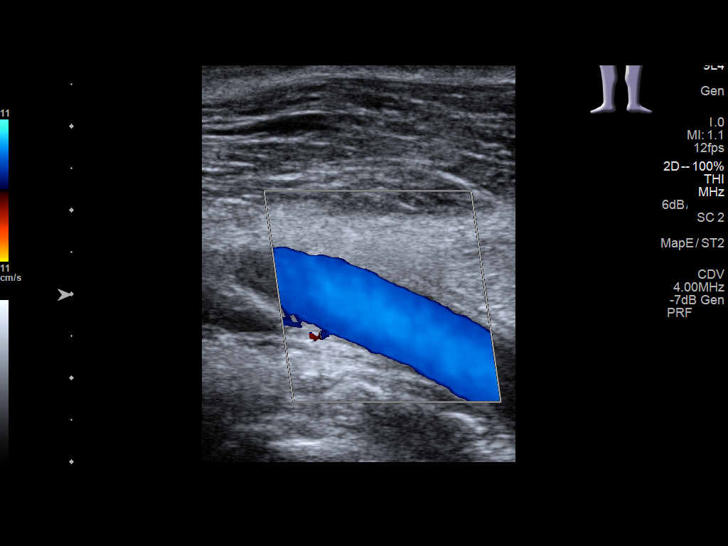
[im 34/34]
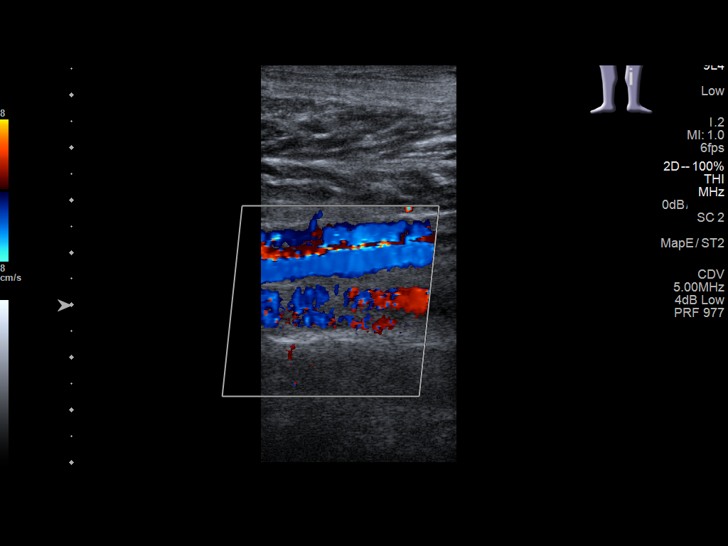

[13 of 24 positions shown; findings below may reference images not displayed]

FINDINGS: Contralateral Common Femoral Vein: Respiratory phasicity is normal
and symmetric with the symptomatic side. No evidence of thrombus.
Normal compressibility.

Common Femoral Vein: No evidence of thrombus. Normal
compressibility, respiratory phasicity and response to augmentation.

Saphenofemoral Junction: No evidence of thrombus. Normal
compressibility and flow on color Doppler imaging.

Profunda Femoral Vein: No evidence of thrombus. Normal
compressibility and flow on color Doppler imaging.

Femoral Vein: No evidence of thrombus. Normal compressibility,
respiratory phasicity and response to augmentation.

Popliteal Vein: No evidence of thrombus. Normal compressibility,
respiratory phasicity and response to augmentation.

Calf Veins: No evidence of thrombus. Normal compressibility and flow
on color Doppler imaging.

Superficial Great Saphenous Vein: No evidence of thrombus. Normal
compressibility and flow on color Doppler imaging.

Venous Reflux:  None.

Other Findings:  None.
IMPRESSION: No evidence of deep venous thrombosis.

## 2017-08-05 ENCOUNTER — Other Ambulatory Visit: Payer: Self-pay | Admitting: Family Medicine

## 2017-08-13 ENCOUNTER — Other Ambulatory Visit: Payer: Self-pay | Admitting: Family Medicine

## 2017-08-26 DIAGNOSIS — B9789 Other viral agents as the cause of diseases classified elsewhere: Secondary | ICD-10-CM | POA: Diagnosis not present

## 2017-08-26 DIAGNOSIS — J069 Acute upper respiratory infection, unspecified: Secondary | ICD-10-CM | POA: Diagnosis not present

## 2017-09-02 ENCOUNTER — Other Ambulatory Visit: Payer: Self-pay | Admitting: Family Medicine

## 2017-10-26 ENCOUNTER — Other Ambulatory Visit: Payer: Self-pay | Admitting: Family Medicine

## 2017-12-25 ENCOUNTER — Other Ambulatory Visit: Payer: Self-pay | Admitting: Family Medicine

## 2018-01-20 ENCOUNTER — Other Ambulatory Visit: Payer: Self-pay | Admitting: Family Medicine

## 2018-01-28 ENCOUNTER — Other Ambulatory Visit: Payer: Self-pay | Admitting: Family Medicine

## 2018-02-03 ENCOUNTER — Other Ambulatory Visit: Payer: Self-pay | Admitting: Family Medicine

## 2018-02-04 ENCOUNTER — Other Ambulatory Visit: Payer: Self-pay | Admitting: Family Medicine

## 2018-02-05 NOTE — Telephone Encounter (Signed)
Last office visit 06/13/2017 for CPE.  Last refilled 09/03/2017 for #90 with 1 refill.  No future appointments.

## 2018-04-23 ENCOUNTER — Other Ambulatory Visit: Payer: Self-pay | Admitting: Family Medicine

## 2018-06-23 ENCOUNTER — Other Ambulatory Visit: Payer: Self-pay | Admitting: Family Medicine

## 2018-07-08 ENCOUNTER — Telehealth: Payer: Self-pay | Admitting: Family Medicine

## 2018-07-08 NOTE — Telephone Encounter (Signed)
Please schedule CPE with fasting labs for Dr. Patsy Lager in 3 months.

## 2018-07-09 NOTE — Telephone Encounter (Signed)
cpx 7/29 Labs 7/24 Pt aware

## 2018-07-24 ENCOUNTER — Other Ambulatory Visit: Payer: Self-pay | Admitting: Family Medicine

## 2018-07-30 ENCOUNTER — Other Ambulatory Visit: Payer: Self-pay | Admitting: Family Medicine

## 2018-10-08 ENCOUNTER — Other Ambulatory Visit: Payer: Self-pay

## 2018-10-08 ENCOUNTER — Other Ambulatory Visit (INDEPENDENT_AMBULATORY_CARE_PROVIDER_SITE_OTHER): Payer: BC Managed Care – PPO

## 2018-10-08 DIAGNOSIS — Z131 Encounter for screening for diabetes mellitus: Secondary | ICD-10-CM | POA: Diagnosis not present

## 2018-10-08 DIAGNOSIS — Z0000 Encounter for general adult medical examination without abnormal findings: Secondary | ICD-10-CM

## 2018-10-08 LAB — HEPATIC FUNCTION PANEL
ALT: 14 U/L (ref 0–53)
AST: 17 U/L (ref 0–37)
Albumin: 4.3 g/dL (ref 3.5–5.2)
Alkaline Phosphatase: 64 U/L (ref 39–117)
Bilirubin, Direct: 0.1 mg/dL (ref 0.0–0.3)
Total Bilirubin: 0.4 mg/dL (ref 0.2–1.2)
Total Protein: 6.4 g/dL (ref 6.0–8.3)

## 2018-10-08 LAB — CBC WITH DIFFERENTIAL/PLATELET
Basophils Absolute: 0 10*3/uL (ref 0.0–0.1)
Basophils Relative: 0.5 % (ref 0.0–3.0)
Eosinophils Absolute: 0.3 10*3/uL (ref 0.0–0.7)
Eosinophils Relative: 6.5 % — ABNORMAL HIGH (ref 0.0–5.0)
HCT: 42 % (ref 39.0–52.0)
Hemoglobin: 14.1 g/dL (ref 13.0–17.0)
Lymphocytes Relative: 31 % (ref 12.0–46.0)
Lymphs Abs: 1.2 10*3/uL (ref 0.7–4.0)
MCHC: 33.5 g/dL (ref 30.0–36.0)
MCV: 96.5 fl (ref 78.0–100.0)
Monocytes Absolute: 0.5 10*3/uL (ref 0.1–1.0)
Monocytes Relative: 13.6 % — ABNORMAL HIGH (ref 3.0–12.0)
Neutro Abs: 1.9 10*3/uL (ref 1.4–7.7)
Neutrophils Relative %: 48.4 % (ref 43.0–77.0)
Platelets: 241 10*3/uL (ref 150.0–400.0)
RBC: 4.35 Mil/uL (ref 4.22–5.81)
RDW: 14.5 % (ref 11.5–15.5)
WBC: 4 10*3/uL (ref 4.0–10.5)

## 2018-10-08 LAB — BASIC METABOLIC PANEL
BUN: 20 mg/dL (ref 6–23)
CO2: 32 mEq/L (ref 19–32)
Calcium: 9.3 mg/dL (ref 8.4–10.5)
Chloride: 102 mEq/L (ref 96–112)
Creatinine, Ser: 1.22 mg/dL (ref 0.40–1.50)
GFR: 60.86 mL/min (ref >60.00)
Glucose, Bld: 93 mg/dL (ref 70–99)
Potassium: 4.3 mEq/L (ref 3.5–5.1)
Sodium: 140 mEq/L (ref 135–145)

## 2018-10-08 LAB — HEMOGLOBIN A1C: Hgb A1c MFr Bld: 5.5 % (ref 4.6–6.5)

## 2018-10-08 LAB — LIPID PANEL
Cholesterol: 115 mg/dL (ref 0–200)
HDL: 57.2 mg/dL (ref >39.00)
LDL Cholesterol: 45 mg/dL (ref 0–99)
NonHDL: 58.28
Total CHOL/HDL Ratio: 2
Triglycerides: 64 mg/dL (ref 0.0–149.0)
VLDL: 12.8 mg/dL (ref 0.0–40.0)

## 2018-10-09 ENCOUNTER — Other Ambulatory Visit: Payer: Self-pay

## 2018-10-09 LAB — PSA, TOTAL WITH REFLEX TO PSA, FREE: PSA, Total: 2.6 ng/mL (ref <=4.0)

## 2018-10-10 ENCOUNTER — Other Ambulatory Visit: Payer: BLUE CROSS/BLUE SHIELD

## 2018-10-12 NOTE — Progress Notes (Signed)
Cesar Alf T. Julliana Whitmyer, MD Primary Care and [place] at [facility] [place], [address] Phone: [phone]  FAX: [phone]  Max Peters - 58 y.o. male  MRN [id number]  Date of Birth: [birth year]  Visit Date: 10/15/2018  PCP: Owens Loffler, MD  Referred by: Owens Loffler, MD  Chief Complaint  Patient presents with  . Annual Exam   Patient Care Team: Owens Loffler, MD as PCP - General (Family Medicine) Subjective:   Max Peters is a 58 y.o. pleasant patient who presents with the following:  Preventative Health Maintenance Visit:  Health Maintenance Summary Reviewed and updated, unless pt declines services.  Tobacco History Reviewed. Alcohol: No concerns, no excessive use Exercise Habits: Some activity, rec at least 30 mins 5 times a week STD concerns: no risk or activity to increase risk Drug Use: None Encouraged self-testicular check  BP Readings from Last 3 Encounters:  10/15/18 (!) 140/100  06/13/17 (!) 126/98  07/16/16 (!) 174/91    Wt Readings from Last 3 Encounters:  10/15/18 199 lb (90.3 kg)  06/13/17 198 lb 12 oz (90.2 kg)  07/16/16 199 lb 8 oz (90.5 kg)     Health Maintenance  Topic Date Due  . INFLUENZA VACCINE  10/18/2018  . COLONOSCOPY  06/02/2023  . TETANUS/TDAP  03/15/2025  . Hepatitis C Screening  Completed  . HIV Screening  Completed   Immunization History  Administered Date(s) Administered  . Influenza,inj,Quad PF,6+ Mos 01/07/2013, 03/16/2015  . Influenza-Unspecified 01/08/2014  . Tdap 03/16/2015   Patient Active Problem List   Diagnosis Date Noted  . Leg edema, left 06/08/2015  . Chronic kidney disease (CKD), stage III (moderate) ([place]) 01/11/2014  . Hypogonadism in male 07/14/2012  . BPH without obstruction/lower urinary tract symptoms 07/14/2012  . ED (erectile dysfunction) of organic origin 01/15/2012  . Insomnia 11/07/2010  . ALLERGIC RHINITIS, CHRONIC  03/23/2010  . Essential hypertension, benign 11/16/2008   Past Medical History:  Diagnosis Date  . Allergy    seasonal  . Chronic kidney disease (CKD), stage III (moderate) ([place]) 01/11/2014  . Hypertension   . Hypogonadism male    Past Surgical History:  Procedure Laterality Date  . SHOULDER SURGERY     Social History   Socioeconomic History  . Marital status: Married    Spouse name: Not on file  . Number of children: Not on file  . Years of education: Not on file  . Highest education level: Not on file  Occupational History  . Occupation: [employer]    Employer: Teacher, music  Social Needs  . Financial resource strain: Not on file  . Food insecurity    Worry: Not on file    Inability: Not on file  . Transportation needs    Medical: Not on file    Non-medical: Not on file  Tobacco Use  . Smoking status: Never Smoker  . Smokeless tobacco: Never Used  Substance and Sexual Activity  . Alcohol use: Yes    Comment: occassionally  . Drug use: No  . Sexual activity: Not on file  Lifestyle  . Physical activity    Days per week: Not on file    Minutes per session: Not on file  . Stress: Not on file  Relationships  . Social Herbalist on phone: Not on file    Gets together: Not on file    Attends religious service: Not on file  Active member of club or organization: Not on file    Attends meetings of clubs or organizations: Not on file    Relationship status: Not on file  . Intimate partner violence    Fear of current or ex partner: Not on file    Emotionally abused: Not on file    Physically abused: Not on file    Forced sexual activity: Not on file  Other Topics Concern  . Not on file  Social History Narrative   Regular exercise--yes   Family History  Problem Relation Age of Onset  . Colon cancer Neg Hx   . Kidney cancer Neg Hx   . Prostate cancer Neg Hx   . Bladder Cancer Neg Hx    No Known Allergies  Medication  list has been reviewed and updated.   General: Denies fever, chills, sweats. No significant weight loss. Eyes: Denies blurring,significant itching ENT: Denies earache, sore throat, and hoarseness. Cardiovascular: Denies chest pains, palpitations, dyspnea on exertion Respiratory: Denies cough, dyspnea at rest,wheeezing Breast: no concerns about lumps GI: Denies nausea, vomiting, diarrhea, constipation, change in bowel habits, abdominal pain, melena, hematochezia GU: Denies penile discharge, ED, urinary flow / outflow problems. No STD concerns. Musculoskeletal: Denies back pain, joint pain Derm: Denies rash, itching Neuro: Denies  paresthesias, frequent falls, frequent headaches Psych: Denies depression, anxiety Endocrine: Denies cold intolerance, heat intolerance, polydipsia Heme: Denies enlarged lymph nodes Allergy: No hayfever  Objective:   BP (!) 140/100   Pulse 87   Temp 98.2 F (36.8 C) (Temporal)   Ht 5\' 6"  (1.676 m)   Wt 199 lb (90.3 kg)   SpO2 98%   BMI 32.12 kg/m  Ideal Body Weight: Weight in (lb) to have BMI = 25: 154.6  Ideal Body Weight: Weight in (lb) to have BMI = 25: 154.6 No exam data present [facility] screen [facility] 2[facility] 10/15/2018 06/13/2017  Decreased Interest 0 0  Down, Depressed, Hopeless 0 0  PHQ - 2 Score 0 0     GEN: well developed, well nourished, no acute distress Eyes: conjunctiva and lids normal, PERRLA, EOMI ENT: TM clear, nares clear, oral exam WNL Neck: supple, no lymphadenopathy, no thyromegaly, no JVD Pulm: clear to auscultation and percussion, respiratory effort normal CV: regular rate and rhythm, S1-S2, no murmur, rub or gallop, no bruits, peripheral pulses normal and symmetric, no cyanosis, clubbing, edema or varicosities GI: soft, non-tender; no hepatosplenomegaly, masses; active bowel sounds all quadrants GU: no hernia, testicular mass, penile discharge Lymph: no cervical, axillary or inguinal adenopathy MSK: gait normal, muscle tone  and strength WNL, no joint swelling, effusions, discoloration, crepitus  SKIN: clear, good turgor, color WNL, no rashes, lesions, or ulcerations Neuro: normal mental status, normal strength, sensation, and motion Psych: alert; oriented to person, place and time, normally interactive and not anxious or depressed in appearance. All labs reviewed with patient. Results for orders placed or performed in visit on 10/08/18  Lipid panel  Result Value Ref Range   Cholesterol 115 0 - 200 mg/dL   Triglycerides 16.164.0 0.0 - 149.0 mg/dL   HDL 09.6057.20 >45.40>39.00 mg/dL   VLDL 98.112.8 0.0 - 19.140.0 mg/dL   LDL Cholesterol 45 0 - 99 mg/dL   Total CHOL/HDL Ratio 2    NonHDL 58.28   Hepatic function panel  Result Value Ref Range   Total Bilirubin 0.4 0.2 - 1.2 mg/dL   Bilirubin, Direct 0.1 0.0 - 0.3 mg/dL   Alkaline Phosphatase 64 39 - 117 U/L   AST  17 0 - 37 U/L   ALT 14 0 - 53 U/L   Total Protein 6.4 6.0 - 8.3 g/dL   Albumin 4.3 3.5 - 5.2 g/dL  Basic metabolic panel  Result Value Ref Range   Sodium 140 135 - 145 mEq/L   Potassium 4.3 3.5 - 5.1 mEq/L   Chloride 102 96 - 112 mEq/L   CO2 32 19 - 32 mEq/L   Glucose, Bld 93 70 - 99 mg/dL   BUN 20 6 - 23 mg/dL   Creatinine, Ser 4.541.22 0.40 - 1.50 mg/dL   Calcium 9.3 8.4 - 09.810.5 mg/dL   GFR 11.9160.86 >47.82>60.00 mL/min  CBC with Differential/Platelet  Result Value Ref Range   WBC 4.0 4.0 - 10.5 K/uL   RBC 4.35 4.22 - 5.81 Mil/uL   Hemoglobin 14.1 13.0 - 17.0 g/dL   HCT 95.642.0 21.339.0 - 08.652.0 %   MCV 96.5 78.0 - 100.0 fl   MCHC 33.5 30.0 - 36.0 g/dL   RDW 57.814.5 46.911.5 - 62.915.5 %   Platelets 241.0 150.0 - 400.0 K/uL   Neutrophils Relative % 48.4 43.0 - 77.0 %   Lymphocytes Relative 31.0 12.0 - 46.0 %   Monocytes Relative 13.6 (H) 3.0 - 12.0 %   Eosinophils Relative 6.5 (H) 0.0 - 5.0 %   Basophils Relative 0.5 0.0 - 3.0 %   Neutro Abs 1.9 1.4 - 7.7 K/uL   Lymphs Abs 1.2 0.7 - 4.0 K/uL   Monocytes Absolute 0.5 0.1 - 1.0 K/uL   Eosinophils Absolute 0.3 0.0 - 0.7 K/uL    Basophils Absolute 0.0 0.0 - 0.1 K/uL  Hemoglobin A1c  Result Value Ref Range   Hgb A1c MFr Bld 5.5 4.6 - 6.5 %  PSA, Total with Reflex to PSA, Free  Result Value Ref Range   PSA, Total 2.6 < OR = 4.0 ng/mL    Assessment and Plan:     ICD-10-CM   1. Healthcare maintenance  Z00.00   2. Other fatigue  R53.83 Testos,Total,Free and SHBG (Male)  3. Hypogonadism in male  E29.1 Testos,Total,Free and SHBG (Male)   At his request, check test, h/o hypogonadism and some fatigue  BP elevated, get cuff and check twice a week.  Call back if > 130/80  Health Maintenance Exam: The patient's preventative maintenance and recommended screening tests for an annual wellness exam were reviewed in full today. Brought up to date unless services declined.  Counselled on the importance of diet, exercise, and its role in overall health and mortality. The patient's FH and SH was reviewed, including their home life, tobacco status, and drug and alcohol status.  Follow-up in 1 year for physical exam or additional follow-up below.  Follow-up: No follow-ups on file. Or follow-up in 1 year if not noted.  Meds ordered this encounter  Medications  . amitriptyline (ELAVIL) 10 MG tablet    Sig: Take 1-2 tablets (10-20 mg total) by mouth at bedtime as needed for sleep.    Dispense:  180 tablet    Refill:  1  . lisinopril (ZESTRIL) 20 MG tablet    Sig: Take 1 tablet (20 mg total) by mouth 2 (two) times daily.    Dispense:  180 tablet    Refill:  1   Medications Discontinued During This Encounter  Medication Reason  . CIALIS 5 MG tablet Completed Course  . testosterone cypionate (DEPOTESTOSTERONE CYPIONATE) 200 MG/ML injection Completed Course  . amitriptyline (ELAVIL) 10 MG tablet   . lisinopril (ZESTRIL) 40  MG tablet    Orders Placed This Encounter  Procedures  . Testos,Total,Free and SHBG (Male)    Signed,  Karleen HampshireSpencer T. Eon Zunker, MD   Allergies as of 10/15/2018   No Known Allergies      Medication List       Accurate as of October 15, 2018 10:40 AM. If you have any questions, ask your nurse or doctor.        STOP taking these medications   Cialis 5 MG tablet Generic drug: tadalafil Stopped by: Hannah BeatSpencer Adalina Dopson, MD   testosterone cypionate 200 MG/ML injection Commonly known as: DEPOTESTOSTERONE CYPIONATE Stopped by: Hannah BeatSpencer Joey Hudock, MD     TAKE these medications   amitriptyline 10 MG tablet Commonly known as: ELAVIL Take 1-2 tablets (10-20 mg total) by mouth at bedtime as needed for sleep. What changed:   how much to take  when to take this  reasons to take this Changed by: Hannah BeatSpencer Arvin Abello, MD   azelastine 0.1 % nasal spray Commonly known as: ASTELIN SPRAY INTO EACH NOSTRIL TWICE DAILY   carvedilol 3.125 MG tablet Commonly known as: COREG TAKE 1 TABLET BY MOUTH TWICE DAILY WITH A MEAL   lisinopril 20 MG tablet Commonly known as: ZESTRIL Take 1 tablet (20 mg total) by mouth 2 (two) times daily. What changed:   medication strength  how much to take  when to take this Changed by: Hannah BeatSpencer Rayelle Armor, MD   sildenafil 20 MG tablet Commonly known as: REVATIO TAKE 2-5 TABLETS BY MOUTH DAILY AS NEEDED   tamsulosin 0.4 MG Caps capsule Commonly known as: FLOMAX TAKE 1 CAPSULE BY MOUTH DAILY   traZODone 100 MG tablet Commonly known as: DESYREL TAKE ONE TABLET AT BEDTIME

## 2018-10-15 ENCOUNTER — Other Ambulatory Visit: Payer: Self-pay

## 2018-10-15 ENCOUNTER — Ambulatory Visit (INDEPENDENT_AMBULATORY_CARE_PROVIDER_SITE_OTHER): Payer: BC Managed Care – PPO | Admitting: Family Medicine

## 2018-10-15 ENCOUNTER — Encounter: Payer: Self-pay | Admitting: Family Medicine

## 2018-10-15 VITALS — BP 140/100 | HR 87 | Temp 98.2°F | Ht 66.0 in | Wt 199.0 lb

## 2018-10-15 DIAGNOSIS — E291 Testicular hypofunction: Secondary | ICD-10-CM

## 2018-10-15 DIAGNOSIS — R5383 Other fatigue: Secondary | ICD-10-CM

## 2018-10-15 DIAGNOSIS — Z0000 Encounter for general adult medical examination without abnormal findings: Secondary | ICD-10-CM | POA: Diagnosis not present

## 2018-10-15 MED ORDER — LISINOPRIL 20 MG PO TABS: 20.0000 mg | ORAL_TABLET | Freq: Two times a day (BID) | ORAL | 1 refills | Status: DC

## 2018-10-15 MED ORDER — AMITRIPTYLINE HCL 10 MG PO TABS: 10.0000 mg | ORAL_TABLET | Freq: Every evening | ORAL | 1 refills | Status: DC | PRN

## 2018-10-15 NOTE — Patient Instructions (Addendum)
Buy a blood pressure cuff and check your blood pressure twice a week.  Change your lisinopril to 20 mg twice a day

## 2018-10-18 LAB — TESTOS,TOTAL,FREE AND SHBG (FEMALE)
Free Testosterone: 38.6 pg/mL (ref 35.0–155.0)
Sex Hormone Binding: 41 nmol/L (ref 22–77)
Testosterone, Total, LC-MS-MS: 364 ng/dL (ref 250–1100)

## 2018-10-28 ENCOUNTER — Other Ambulatory Visit: Payer: Self-pay | Admitting: Family Medicine

## 2018-12-18 ENCOUNTER — Other Ambulatory Visit: Payer: Self-pay | Admitting: Family Medicine

## 2019-01-19 ENCOUNTER — Other Ambulatory Visit: Payer: Self-pay | Admitting: Family Medicine

## 2019-02-18 DIAGNOSIS — Z20828 Contact with and (suspected) exposure to other viral communicable diseases: Secondary | ICD-10-CM | POA: Diagnosis not present

## 2019-03-16 ENCOUNTER — Other Ambulatory Visit: Payer: Self-pay | Admitting: Family Medicine

## 2019-03-16 NOTE — Telephone Encounter (Signed)
Last filled on 01/20/2018 #90 with 5 refills. LOV 10/15/2018 CPE  No future appointments.

## 2019-06-05 ENCOUNTER — Ambulatory Visit: Payer: BC Managed Care – PPO | Attending: Internal Medicine

## 2019-06-05 DIAGNOSIS — Z23 Encounter for immunization: Secondary | ICD-10-CM

## 2019-06-05 NOTE — Progress Notes (Signed)
   Covid-19 Vaccination Clinic  Name:  Max Peters    MRN: [id number] DOB: [birth year]  06/05/2019  [name] was observed post Covid-19 immunization for 15 minutes without incident. He was provided with Vaccine Information Sheet and instruction to access the V-Safe system.   [name] was instructed to call 911 with any severe reactions post vaccine: Marland Kitchen Difficulty breathing  . Swelling of face and throat  . A fast heartbeat  . A bad rash all over body  . Dizziness and weakness   Immunizations Administered    Name Date Dose VIS Date Route   Pfizer COVID-19 Vaccine 06/05/2019 10:11 AM 0.3 mL 02/27/2019 Intramuscular   Manufacturer: [employer], [employer]   Lot: [id number]   NDC: [address]-9

## 2019-06-13 ENCOUNTER — Other Ambulatory Visit: Payer: Self-pay | Admitting: Family Medicine

## 2019-07-01 ENCOUNTER — Ambulatory Visit: Payer: BC Managed Care – PPO | Attending: Internal Medicine

## 2019-07-01 DIAGNOSIS — Z23 Encounter for immunization: Secondary | ICD-10-CM

## 2019-07-01 NOTE — Progress Notes (Signed)
   Covid-19 Vaccination Clinic  Name:  Max Peters    MRN: [id number] DOB: 04-15-1960  07/01/2019  [name] was observed post Covid-19 immunization for 15 minutes without incident. He was provided with Vaccine Information Sheet and instruction to access the V-Safe system.   [name] was instructed to call 911 with any severe reactions post vaccine: Marland Kitchen Difficulty breathing  . Swelling of face and throat  . A fast heartbeat  . A bad rash all over body  . Dizziness and weakness   Immunizations Administered    Name Date Dose VIS Date Route   Pfizer COVID-19 Vaccine 07/01/2019  8:44 AM 0.3 mL 02/27/2019 Intramuscular   Manufacturer: [employer], [employer]   Lot: [id number]   NDC: [address]-4

## 2019-07-25 ENCOUNTER — Other Ambulatory Visit: Payer: Self-pay | Admitting: Family Medicine

## 2019-07-27 NOTE — Telephone Encounter (Signed)
Last filled on 10/15/2018 # 180 with 1 refill  LOV 10/15/2018 CPE No future appointments

## 2019-09-07 ENCOUNTER — Other Ambulatory Visit: Payer: Self-pay | Admitting: Family Medicine

## 2019-10-07 ENCOUNTER — Telehealth: Payer: Self-pay | Admitting: Family Medicine

## 2019-10-07 NOTE — Telephone Encounter (Signed)
Please try and schedule CPE with fasting labs prior with Dr. Patsy Lager for end of July/first of August.

## 2019-10-08 NOTE — Telephone Encounter (Signed)
Left message asking pt to call office  °

## 2019-10-20 ENCOUNTER — Other Ambulatory Visit: Payer: Self-pay | Admitting: Family Medicine

## 2019-10-20 DIAGNOSIS — Z125 Encounter for screening for malignant neoplasm of prostate: Secondary | ICD-10-CM

## 2019-10-20 DIAGNOSIS — E291 Testicular hypofunction: Secondary | ICD-10-CM

## 2019-10-20 DIAGNOSIS — N529 Male erectile dysfunction, unspecified: Secondary | ICD-10-CM

## 2019-10-20 DIAGNOSIS — Z13220 Encounter for screening for lipoid disorders: Secondary | ICD-10-CM

## 2019-10-20 DIAGNOSIS — Z79899 Other long term (current) drug therapy: Secondary | ICD-10-CM

## 2019-10-20 DIAGNOSIS — Z131 Encounter for screening for diabetes mellitus: Secondary | ICD-10-CM

## 2019-10-22 ENCOUNTER — Other Ambulatory Visit: Payer: Self-pay

## 2019-10-22 ENCOUNTER — Other Ambulatory Visit (INDEPENDENT_AMBULATORY_CARE_PROVIDER_SITE_OTHER): Payer: BC Managed Care – PPO

## 2019-10-22 DIAGNOSIS — Z131 Encounter for screening for diabetes mellitus: Secondary | ICD-10-CM

## 2019-10-22 DIAGNOSIS — Z125 Encounter for screening for malignant neoplasm of prostate: Secondary | ICD-10-CM

## 2019-10-22 DIAGNOSIS — N529 Male erectile dysfunction, unspecified: Secondary | ICD-10-CM | POA: Diagnosis not present

## 2019-10-22 DIAGNOSIS — Z79899 Other long term (current) drug therapy: Secondary | ICD-10-CM

## 2019-10-22 DIAGNOSIS — E291 Testicular hypofunction: Secondary | ICD-10-CM

## 2019-10-22 DIAGNOSIS — Z13220 Encounter for screening for lipoid disorders: Secondary | ICD-10-CM

## 2019-10-22 LAB — BASIC METABOLIC PANEL
BUN: 28 mg/dL — ABNORMAL HIGH (ref 6–23)
CO2: 30 mEq/L (ref 19–32)
Calcium: 9.5 mg/dL (ref 8.4–10.5)
Chloride: 104 mEq/L (ref 96–112)
Creatinine, Ser: 1.39 mg/dL (ref 0.40–1.50)
GFR: 52.16 mL/min — ABNORMAL LOW (ref >60.00)
Glucose, Bld: 97 mg/dL (ref 70–99)
Potassium: 4.3 mEq/L (ref 3.5–5.1)
Sodium: 138 mEq/L (ref 135–145)

## 2019-10-22 LAB — CBC WITH DIFFERENTIAL/PLATELET
Basophils Absolute: 0 10*3/uL (ref 0.0–0.1)
Basophils Relative: 0.6 % (ref 0.0–3.0)
Eosinophils Absolute: 0.2 10*3/uL (ref 0.0–0.7)
Eosinophils Relative: 5.8 % — ABNORMAL HIGH (ref 0.0–5.0)
HCT: 42.1 % (ref 39.0–52.0)
Hemoglobin: 14.1 g/dL (ref 13.0–17.0)
Lymphocytes Relative: 34.5 % (ref 12.0–46.0)
Lymphs Abs: 1.4 10*3/uL (ref 0.7–4.0)
MCHC: 33.5 g/dL (ref 30.0–36.0)
MCV: 97.7 fl (ref 78.0–100.0)
Monocytes Absolute: 0.5 10*3/uL (ref 0.1–1.0)
Monocytes Relative: 13.7 % — ABNORMAL HIGH (ref 3.0–12.0)
Neutro Abs: 1.8 10*3/uL (ref 1.4–7.7)
Neutrophils Relative %: 45.4 % (ref 43.0–77.0)
Platelets: 224 10*3/uL (ref 150.0–400.0)
RBC: 4.3 Mil/uL (ref 4.22–5.81)
RDW: 14.2 % (ref 11.5–15.5)
WBC: 3.9 10*3/uL — ABNORMAL LOW (ref 4.0–10.5)

## 2019-10-22 LAB — LIPID PANEL
Cholesterol: 115 mg/dL (ref 0–200)
HDL: 52.4 mg/dL (ref >39.00)
LDL Cholesterol: 50 mg/dL (ref 0–99)
NonHDL: 63.07
Total CHOL/HDL Ratio: 2
Triglycerides: 63 mg/dL (ref 0.0–149.0)
VLDL: 12.6 mg/dL (ref 0.0–40.0)

## 2019-10-22 LAB — HEPATIC FUNCTION PANEL
ALT: 18 U/L (ref 0–53)
AST: 21 U/L (ref 0–37)
Albumin: 4.3 g/dL (ref 3.5–5.2)
Alkaline Phosphatase: 57 U/L (ref 39–117)
Bilirubin, Direct: 0.1 mg/dL (ref 0.0–0.3)
Total Bilirubin: 0.4 mg/dL (ref 0.2–1.2)
Total Protein: 6.9 g/dL (ref 6.0–8.3)

## 2019-10-22 LAB — HEMOGLOBIN A1C: Hgb A1c MFr Bld: 5.7 % (ref 4.6–6.5)

## 2019-10-22 NOTE — Telephone Encounter (Signed)
Labs 8/5 cpx 8/12

## 2019-10-26 LAB — TESTOS,TOTAL,FREE AND SHBG (FEMALE)
Free Testosterone: 73.4 pg/mL (ref 35.0–155.0)
Sex Hormone Binding: 40 nmol/L (ref 22–77)
Testosterone, Total, LC-MS-MS: 429 ng/dL (ref 250–1100)

## 2019-10-26 LAB — PSA, TOTAL WITH REFLEX TO PSA, FREE: PSA, Total: 3.4 ng/mL (ref <=4.0)

## 2019-10-29 ENCOUNTER — Encounter: Payer: Self-pay | Admitting: Family Medicine

## 2019-10-29 ENCOUNTER — Other Ambulatory Visit: Payer: Self-pay

## 2019-10-29 ENCOUNTER — Ambulatory Visit (INDEPENDENT_AMBULATORY_CARE_PROVIDER_SITE_OTHER): Payer: BC Managed Care – PPO | Admitting: Family Medicine

## 2019-10-29 VITALS — BP 120/74 | HR 68 | Temp 98.0°F | Ht 66.5 in | Wt 200.0 lb

## 2019-10-29 DIAGNOSIS — Z0000 Encounter for general adult medical examination without abnormal findings: Secondary | ICD-10-CM | POA: Diagnosis not present

## 2019-10-29 NOTE — Progress Notes (Signed)
Darvin Dials T. Kamayah Pillay, MD, CAQ Sports Medicine  Primary Care and Sports Medicine [facility] [address] [place], [address]  Phone: [phone]  FAX: [phone]  YUSUKE BEZA - 59 y.o. male  MRN [id number]  Date of Birth: 06-29-1960  Date: 10/29/2019  PCP: Hannah Beat, MD  Referral: Hannah Beat, MD  Chief Complaint  Patient presents with  . Annual Exam    This visit occurred during the SARS-CoV-2 public health emergency.  Safety protocols were in place, including screening questions prior to the visit, additional usage of staff PPE, and extensive cleaning of exam room while observing appropriate contact time as indicated for disinfecting solutions.   Patient Care Team: Hannah Beat, MD as PCP - General (Family Medicine) Subjective:   JANET DECESARE is a 59 y.o. pleasant patient who presents with the following:  Preventative Health Maintenance Visit:  Health Maintenance Summary Reviewed and updated, unless pt declines services.  Tobacco History Reviewed. Alcohol: No concerns, no excessive use, 1 beer a day Exercise Habits: Some activity, rec at least 30 mins 5 times a week, 3-4 times a week and then a lot of walking. STD concerns: no risk or activity to increase risk, long-term girlfriend. Drug Use: None  Has been travelling some.   Some supplements -he reviewed all of these with me in the office today.  Nothing appears to be harmful and nothing exogenous.   Health Maintenance  Topic Date Due  . INFLUENZA VACCINE  10/18/2019  . COLONOSCOPY  06/02/2023  . TETANUS/TDAP  03/15/2025  . COVID-19 Vaccine  Completed  . Hepatitis C Screening  Completed  . HIV Screening  Completed   Immunization History  Administered Date(s) Administered  . Influenza,inj,Quad PF,6+ Mos 01/07/2013, 03/16/2015  . Influenza-Unspecified 01/08/2014  . PFIZER SARS-COV-2 Vaccination 06/05/2019, 07/01/2019  . Tdap 03/16/2015   Patient  Active Problem List   Diagnosis Date Noted  . Leg edema, left 06/08/2015  . Chronic kidney disease (CKD), stage III (moderate) 01/11/2014  . Hypogonadism in male 07/14/2012  . BPH without obstruction/lower urinary tract symptoms 07/14/2012  . ED (erectile dysfunction) of organic origin 01/15/2012  . Insomnia 11/07/2010  . ALLERGIC RHINITIS, CHRONIC 03/23/2010  . Essential hypertension, benign 11/16/2008    Past Medical History:  Diagnosis Date  . Allergy    seasonal  . Chronic kidney disease (CKD), stage III (moderate) 01/11/2014  . Hypertension   . Hypogonadism male     Past Surgical History:  Procedure Laterality Date  . SHOULDER SURGERY      Family History  Problem Relation Age of Onset  . Colon cancer Neg Hx   . Kidney cancer Neg Hx   . Prostate cancer Neg Hx   . Bladder Cancer Neg Hx     Past Medical History, Surgical History, Social History, Family History, Problem List, Medications, and Allergies have been reviewed and updated if relevant.  Review of Systems: Pertinent positives are listed above.  Otherwise, a full 14 point review of systems has been done in full and it is negative except where it is noted positive.  Objective:   BP 120/74 (BP Location: Left Arm, Patient Position: Sitting, Cuff Size: Normal)   Pulse 68   Temp 98 F (36.7 C) (Temporal)   Ht 5' 6.5" (1.689 m)   Wt 200 lb (90.7 kg)   SpO2 97%   BMI 31.80 kg/m  Ideal Body Weight: Weight in (lb) to have BMI = 25: 156.9  Ideal Body Weight: Weight in (lb) to have BMI = 25: 156.9 No exam data present Depression screen [facility] 2/9 10/29/2019 10/15/2018 06/13/2017  Decreased Interest 0 0 0  Down, Depressed, Hopeless 0 0 0  PHQ - 2 Score 0 0 0  Altered sleeping 0 - -  Tired, decreased energy 0 - -  Change in appetite 0 - -  Feeling bad or failure about yourself  0 - -  Trouble concentrating 0 - -  Moving slowly or fidgety/restless 0 - -  Suicidal thoughts 0 - -  PHQ-9 Score 0 - -  Difficult doing  work/chores Not difficult at all - -     GEN: well developed, well nourished, no acute distress Eyes: conjunctiva and lids normal, PERRLA, EOMI ENT: TM clear, nares clear, oral exam WNL Neck: supple, no lymphadenopathy, no thyromegaly, no JVD Pulm: clear to auscultation and percussion, respiratory effort normal CV: regular rate and rhythm, S1-S2, no murmur, rub or gallop, no bruits, peripheral pulses normal and symmetric, no cyanosis, clubbing, edema or varicosities GI: soft, non-tender; no hepatosplenomegaly, masses; active bowel sounds all quadrants GU: no hernia, testicular mass, penile discharge Lymph: no cervical, axillary or inguinal adenopathy MSK: gait normal, muscle tone and strength WNL, no joint swelling, effusions, discoloration, crepitus  SKIN: clear, good turgor, color WNL, no rashes, lesions, or ulcerations Neuro: normal mental status, normal strength, sensation, and motion Psych: alert; oriented to person, place and time, normally interactive and not anxious or depressed in appearance.  All labs reviewed with patient. Results for orders placed or performed in visit on 10/22/19  PSA, Total with Reflex to PSA, Free  Result Value Ref Range   PSA, Total 3.4 < OR = 4.0 ng/mL  Hemoglobin A1c  Result Value Ref Range   Hgb A1c MFr Bld 5.7 4.6 - 6.5 %  CBC with Differential/Platelet  Result Value Ref Range   WBC 3.9 (L) 4.0 - 10.5 K/uL   RBC 4.30 4.22 - 5.81 Mil/uL   Hemoglobin 14.1 13.0 - 17.0 g/dL   HCT 32.2 39 - 52 %   MCV 97.7 78.0 - 100.0 fl   MCHC 33.5 30.0 - 36.0 g/dL   RDW 02.5 42.7 - 06.2 %   Platelets 224.0 150 - 400 K/uL   Neutrophils Relative % 45.4 43 - 77 %   Lymphocytes Relative 34.5 12 - 46 %   Monocytes Relative 13.7 (H) 3 - 12 %   Eosinophils Relative 5.8 (H) 0 - 5 %   Basophils Relative 0.6 0 - 3 %   Neutro Abs 1.8 1.4 - 7.7 K/uL   Lymphs Abs 1.4 0.7 - 4.0 K/uL   Monocytes Absolute 0.5 0 - 1 K/uL   Eosinophils Absolute 0.2 0 - 0 K/uL   Basophils  Absolute 0.0 0 - 0 K/uL  Basic metabolic panel  Result Value Ref Range   Sodium 138 135 - 145 mEq/L   Potassium 4.3 3.5 - 5.1 mEq/L   Chloride 104 96 - 112 mEq/L   CO2 30 19 - 32 mEq/L   Glucose, Bld 97 70 - 99 mg/dL   BUN 28 (H) 6 - 23 mg/dL   Creatinine, Ser 3.76 0.40 - 1.50 mg/dL   GFR 28.31 (L) >51.76 mL/min   Calcium 9.5 8.4 - 10.5 mg/dL  Hepatic function panel  Result Value Ref Range   Total Bilirubin 0.4 0.2 - 1.2 mg/dL   Bilirubin, Direct 0.1 0.0 - 0.3 mg/dL   Alkaline Phosphatase 57 39 -  117 U/L   AST 21 0 - 37 U/L   ALT 18 0 - 53 U/L   Total Protein 6.9 6.0 - 8.3 g/dL   Albumin 4.3 3.5 - 5.2 g/dL  Lipid panel  Result Value Ref Range   Cholesterol 115 0 - 200 mg/dL   Triglycerides 70.9 0 - 149 mg/dL   HDL 62.83 >66.29 mg/dL   VLDL 47.6 0.0 - 54.6 mg/dL   LDL Cholesterol 50 0 - 99 mg/dL   Total CHOL/HDL Ratio 2    NonHDL 63.07   Testos,Total,Free and SHBG (Male)  Result Value Ref Range   Testosterone, Total, LC-MS-MS 429 250 - 1,100 ng/dL   Free Testosterone 50.3 35.0 - 155.0 pg/mL   Sex Hormone Binding 40 22 - 77 nmol/L    Assessment and Plan:     ICD-10-CM   1. Healthcare maintenance  Z00.00    Continue to work on fitness which he does normally as well as clean diet. Otherwise, he is doing quite well.  Health Maintenance Exam: The patient's preventative maintenance and recommended screening tests for an annual wellness exam were reviewed in full today. Brought up to date unless services declined.  Counselled on the importance of diet, exercise, and its role in overall health and mortality. The patient's FH and SH was reviewed, including their home life, tobacco status, and drug and alcohol status.  Follow-up in 1 year for physical exam or additional follow-up below.  Follow-up: No follow-ups on file. Or follow-up in 1 year if not noted.  No orders of the defined types were placed in this encounter.  There are no discontinued medications. No  orders of the defined types were placed in this encounter.   Signed,  Elpidio Galea. Jared Whorley, MD   Allergies as of 10/29/2019   No Known Allergies     Medication List       Accurate as of October 29, 2019  9:26 AM. If you have any questions, ask your nurse or doctor.        amitriptyline 10 MG tablet Commonly known as: ELAVIL TAKE 1 TO 2 TABLETS AT BEDTIME AS NEEDEDFOR SLEEP   azelastine 0.1 % nasal spray Commonly known as: ASTELIN SPRAY INTO EACH NOSTRIL TWICE DAILY   carvedilol 3.125 MG tablet Commonly known as: COREG TAKE ONE TABLET TWICE A DAY WITH MEALS   lisinopril 20 MG tablet Commonly known as: ZESTRIL TAKE ONE TABLET TWICE DAILY   sildenafil 20 MG tablet Commonly known as: REVATIO TAKE 2-5 TABLETS BY MOUTH EVERY DAY AS NEEDED   tamsulosin 0.4 MG Caps capsule Commonly known as: FLOMAX TAKE 1 CAPSULE BY MOUTH EVERY DAY   traZODone 100 MG tablet Commonly known as: DESYREL TAKE ONE TABLET AT BEDTIME

## 2019-11-24 ENCOUNTER — Encounter: Payer: Self-pay | Admitting: Family Medicine

## 2019-11-24 ENCOUNTER — Other Ambulatory Visit: Payer: Self-pay

## 2019-11-24 ENCOUNTER — Telehealth (INDEPENDENT_AMBULATORY_CARE_PROVIDER_SITE_OTHER): Payer: BC Managed Care – PPO | Admitting: Family Medicine

## 2019-11-24 DIAGNOSIS — R05 Cough: Secondary | ICD-10-CM | POA: Diagnosis not present

## 2019-11-24 DIAGNOSIS — Z03818 Encounter for observation for suspected exposure to other biological agents ruled out: Secondary | ICD-10-CM | POA: Diagnosis not present

## 2019-11-24 DIAGNOSIS — R059 Cough, unspecified: Secondary | ICD-10-CM

## 2019-11-24 MED ORDER — AZITHROMYCIN 250 MG PO TABS
ORAL_TABLET | ORAL | 0 refills | Status: DC
Start: 2019-11-24 — End: 2020-10-31

## 2019-11-24 MED ORDER — GUAIFENESIN-CODEINE 100-10 MG/5ML PO SYRP: 5.0000 mL | ORAL_SOLUTION | Freq: Every evening | ORAL | 0 refills | Status: DC | PRN

## 2019-11-24 NOTE — Progress Notes (Addendum)
VIRTUAL VISIT Due to national recommendations of social distancing due to COVID 19, a virtual visit is felt to be most appropriate for this patient at this time.   I connected with the patient on 11/24/19 at  9:40 AM EDT by virtual telehealth platform and verified that I am speaking with the correct person using two identifiers.   Interactive audio and video telecommunications were attempted between this provider and patient, however failed, due to patient having technical difficulties OR patient did not have access to video capability.  We continued and completed visit with audio only.   Interactive audio and video telecommunications were attempted between this provider and patient, however failed, due to patient having technical difficulties OR patient did not have access to video capability.  We continued and completed visit with audio only.   I discussed the limitations, risks, security and privacy concerns of performing an evaluation and management service by  virtual telehealth platform and the availability of in person appointments. I also discussed with the patient that there may be a patient responsible charge related to this service. The patient expressed understanding and agreed to proceed.  Patient location: Home Provider Location: [facility] Participants: Kerby Nora and Nedra Hai   Chief Complaint  Patient presents with  . Cough    x4days..pt states chest discomfort due to couging, coughing up phlegm  . sneezing    wants rx for nasal spray    History of Present Illness: Cough This is a new problem. The current episode started in the past 7 days. The problem has been gradually improving. The cough is productive of sputum. Associated symptoms include nasal congestion and a sore throat. Pertinent negatives include no chills, ear pain, fever, myalgias, postnasal drip, shortness of breath or wheezing. Associated symptoms comments: Sneeze Chest soreness with  coughing)  no sinus pain, no headache. The symptoms are aggravated by lying down (cannot sleep at night with cough). Risk factors: nonsmoker. He has tried OTC cough suppressant (mucinex DM) for the symptoms. The treatment provided mild relief. There is no history of asthma, COPD or environmental allergies.    S/P COVID19 vaccine series.   COVID 19 screen No recent travel or known exposure to COVID19 The importance of social distancing was discussed today.   Review of Systems  Constitutional: Negative for chills and fever.  HENT: Positive for sore throat. Negative for ear pain and postnasal drip.   Respiratory: Positive for cough. Negative for shortness of breath and wheezing.   Musculoskeletal: Negative for myalgias.  Endo/Heme/Allergies: Negative for environmental allergies.      Past Medical History:  Diagnosis Date  . Allergy    seasonal  . Chronic kidney disease (CKD), stage III (moderate) 01/11/2014  . Hypertension   . Hypogonadism male     reports that he has never smoked. He has never used smokeless tobacco. He reports current alcohol use. He reports that he does not use drugs.   Current Outpatient Medications:  .  amitriptyline (ELAVIL) 10 MG tablet, TAKE 1 TO 2 TABLETS AT BEDTIME AS NEEDEDFOR SLEEP, Disp: 180 tablet, Rfl: 1 .  carvedilol (COREG) 3.125 MG tablet, TAKE ONE TABLET TWICE A DAY WITH MEALS, Disp: 180 tablet, Rfl: 0 .  lisinopril (ZESTRIL) 20 MG tablet, TAKE ONE TABLET TWICE DAILY, Disp: 180 tablet, Rfl: 0 .  sildenafil (REVATIO) 20 MG tablet, TAKE 2-5 TABLETS BY MOUTH EVERY DAY AS NEEDED, Disp: 90 tablet, Rfl: 5 .  tamsulosin (FLOMAX) 0.4 MG CAPS capsule, TAKE  1 CAPSULE BY MOUTH EVERY DAY, Disp: 90 capsule, Rfl: 0 .  traZODone (DESYREL) 100 MG tablet, TAKE ONE TABLET AT BEDTIME, Disp: 90 tablet, Rfl: 1 .  azelastine (ASTELIN) 0.1 % nasal spray, SPRAY INTO EACH NOSTRIL TWICE DAILY (Patient not taking: Reported on 11/24/2019), Disp: 30 mL, Rfl: 5    Observations/Objective: There were no vitals taken for this visit.  Physical Exam  Physical Exam Constitutional:      General: The patient is not in acute distress. Pulmonary:     Effort: Pulmonary effort is normal. No respiratory distress.  Neurological:     Mental Status: The patient is alert and oriented to person, place, and time.  Psychiatric:        Mood and Affect: Mood normal.        Behavior: Behavior normal.  Assessment and Plan Cough  Likely COVID19  infection vs other viral infection. No clear sign of bacterial infection at this time.  Recommend testing .. info on how to obtain testing provided through MyChart. No SOB.  No red flags/need for ER visit or in-person exam at respiratory clinic at this time..  Most l;iekly viral URI... symptomatic care. If not improving as expected in next 3-4 days fill rx for antibiotics to cover for bacterial superinfection. If still not improving have Urgent Care office visit for in person exam. Doubt COVID given vaccinated but recommend testing before returning to work.  If SOB begins symptoms worsening.. have low threshold for in-person exam, if severe shortness of breath ER visit recommended.  Can monitor Oxygen saturation at home with home monitor if able to obtain.  Go to ER if O2 sat < 90% on room air.  Reviewed home care and provided information through MyChart.  Recommended isolation until test returns. If returns positive 10 days quarantine recommended.  Provided info about prevention of spread of COVID 19.   Total visit time 15 minutes, > 50% spent counseling and cordinating patients care.    I discussed the assessment and treatment plan with the patient. The patient was provided an opportunity to ask questions and all were answered. The patient agreed with the plan and demonstrated an understanding of the instructions.   The patient was advised to call back or seek an in-person evaluation if the symptoms worsen or if the  condition fails to improve as anticipated.     Kerby Nora, MD

## 2019-11-24 NOTE — Assessment & Plan Note (Signed)
Likely COVID19  infection vs other viral infection. No clear sign of bacterial infection at this time.  Recommend testing .. info on how to obtain testing provided through MyChart. No SOB.  No red flags/need for ER visit or in-person exam at respiratory clinic at this time..  Most l;iekly viral URI... symptomatic care. If not improving as expected in next 3-4 days fill rx for antibiotics to cover for bacterial superinfection. If still not improving have Urgent Care office visit for in person exam. Doubt COVID given vaccinated but recommend testing before returning to work.  If SOB begins symptoms worsening.. have low threshold for in-person exam, if severe shortness of breath ER visit recommended.  Can monitor Oxygen saturation at home with home monitor if able to obtain.  Go to ER if O2 sat < 90% on room air.  Reviewed home care and provided information through MyChart.  Recommended isolation until test returns. If returns positive 10 days quarantine recommended.  Provided info about prevention of spread of COVID 19.

## 2019-11-24 NOTE — Progress Notes (Signed)
Work note sent to patient via MyChart as instructed by Dr. Ermalene Searing.

## 2019-11-24 NOTE — Patient Instructions (Addendum)
How to make an appointment for COVID testing  TEXT: COVID to [phone]  or CALL: [phone] or  ONLINE: [email or web address]  Locations:  [place]: [facility] [address] Geophysicist/field seismologist [place]: [address] at [facility]   Likely COVID19  infection vs other viral infection. No clear sign of bacterial infection at this time.  Recommend testing .. info on how to obtain testing provided through MyChart. No SOB.  No red flags/need for ER visit or in-person exam at respiratory clinic at this time..  Most l;iekly viral URI... symptomatic care. If not improving as expected in next 3-4 days fill rx for antibiotics to cover for bacterial superinfection. If still not improving have Urgent Care office visit for in person exam. Doubt COVID given vaccinated but recommend testing before returning to work.  If SOB begins symptoms worsening.. have low threshold for in-person exam, if severe shortness of breath ER visit recommended.  Can monitor Oxygen saturation at home with home monitor if able to obtain.  Go to ER if O2 sat < 90% on room air.  Reviewed home care and provided information through MyChart.  Recommended isolation until test returns. If returns positive 10 days quarantine recommended.  Provided info about prevention of spread of COVID 19.

## 2019-11-25 ENCOUNTER — Other Ambulatory Visit: Payer: Self-pay

## 2019-11-25 ENCOUNTER — Other Ambulatory Visit: Payer: BC Managed Care – PPO

## 2019-11-25 DIAGNOSIS — Z20822 Contact with and (suspected) exposure to covid-19: Secondary | ICD-10-CM

## 2019-11-27 LAB — NOVEL CORONAVIRUS, NAA: SARS-CoV-2, NAA: NOT DETECTED

## 2019-11-27 LAB — SARS-COV-2, NAA 2 DAY TAT

## 2019-12-10 ENCOUNTER — Other Ambulatory Visit: Payer: Self-pay | Admitting: Family Medicine

## 2019-12-11 ENCOUNTER — Other Ambulatory Visit: Payer: Self-pay | Admitting: Family Medicine

## 2020-01-07 ENCOUNTER — Other Ambulatory Visit: Payer: Self-pay | Admitting: Family Medicine

## 2020-02-16 DIAGNOSIS — Z23 Encounter for immunization: Secondary | ICD-10-CM | POA: Diagnosis not present

## 2020-03-05 ENCOUNTER — Other Ambulatory Visit: Payer: Self-pay | Admitting: Family Medicine

## 2020-03-26 ENCOUNTER — Other Ambulatory Visit: Payer: Self-pay | Admitting: Family Medicine

## 2020-04-11 ENCOUNTER — Other Ambulatory Visit: Payer: Self-pay | Admitting: Family Medicine

## 2020-04-14 DIAGNOSIS — Z20822 Contact with and (suspected) exposure to covid-19: Secondary | ICD-10-CM | POA: Diagnosis not present

## 2020-06-16 DIAGNOSIS — M109 Gout, unspecified: Secondary | ICD-10-CM | POA: Diagnosis not present

## 2020-07-16 ENCOUNTER — Other Ambulatory Visit: Payer: Self-pay | Admitting: Family Medicine

## 2020-08-27 ENCOUNTER — Other Ambulatory Visit: Payer: Self-pay | Admitting: Family Medicine

## 2020-10-13 ENCOUNTER — Other Ambulatory Visit: Payer: Self-pay | Admitting: Family Medicine

## 2020-10-15 ENCOUNTER — Other Ambulatory Visit: Payer: Self-pay | Admitting: Family Medicine

## 2020-10-25 ENCOUNTER — Other Ambulatory Visit: Payer: Self-pay

## 2020-10-25 ENCOUNTER — Other Ambulatory Visit (INDEPENDENT_AMBULATORY_CARE_PROVIDER_SITE_OTHER): Payer: BC Managed Care – PPO

## 2020-10-25 DIAGNOSIS — E291 Testicular hypofunction: Secondary | ICD-10-CM

## 2020-10-25 DIAGNOSIS — Z13220 Encounter for screening for lipoid disorders: Secondary | ICD-10-CM | POA: Diagnosis not present

## 2020-10-25 DIAGNOSIS — R6882 Decreased libido: Secondary | ICD-10-CM

## 2020-10-25 DIAGNOSIS — Z79899 Other long term (current) drug therapy: Secondary | ICD-10-CM

## 2020-10-25 DIAGNOSIS — Z125 Encounter for screening for malignant neoplasm of prostate: Secondary | ICD-10-CM

## 2020-10-25 LAB — CBC WITH DIFFERENTIAL/PLATELET
Basophils Absolute: 0 10*3/uL (ref 0.0–0.1)
Basophils Relative: 0.8 % (ref 0.0–3.0)
Eosinophils Absolute: 0.2 10*3/uL (ref 0.0–0.7)
Eosinophils Relative: 6.8 % — ABNORMAL HIGH (ref 0.0–5.0)
HCT: 41.7 % (ref 39.0–52.0)
Hemoglobin: 13.9 g/dL (ref 13.0–17.0)
Lymphocytes Relative: 37.6 % (ref 12.0–46.0)
Lymphs Abs: 1.3 10*3/uL (ref 0.7–4.0)
MCHC: 33.4 g/dL (ref 30.0–36.0)
MCV: 96.4 fl (ref 78.0–100.0)
Monocytes Absolute: 0.5 10*3/uL (ref 0.1–1.0)
Monocytes Relative: 13.5 % — ABNORMAL HIGH (ref 3.0–12.0)
Neutro Abs: 1.4 10*3/uL (ref 1.4–7.7)
Neutrophils Relative %: 41.3 % — ABNORMAL LOW (ref 43.0–77.0)
Platelets: 234 10*3/uL (ref 150.0–400.0)
RBC: 4.33 Mil/uL (ref 4.22–5.81)
RDW: 15.1 % (ref 11.5–15.5)
WBC: 3.4 10*3/uL — ABNORMAL LOW (ref 4.0–10.5)

## 2020-10-25 LAB — LIPID PANEL
Cholesterol: 122 mg/dL (ref 0–200)
HDL: 66.9 mg/dL (ref >39.00)
LDL Cholesterol: 43 mg/dL (ref 0–99)
NonHDL: 54.7
Total CHOL/HDL Ratio: 2
Triglycerides: 58 mg/dL (ref 0.0–149.0)
VLDL: 11.6 mg/dL (ref 0.0–40.0)

## 2020-10-25 LAB — HEPATIC FUNCTION PANEL
ALT: 17 U/L (ref 0–53)
AST: 18 U/L (ref 0–37)
Albumin: 4.2 g/dL (ref 3.5–5.2)
Alkaline Phosphatase: 59 U/L (ref 39–117)
Bilirubin, Direct: 0.1 mg/dL (ref 0.0–0.3)
Total Bilirubin: 0.5 mg/dL (ref 0.2–1.2)
Total Protein: 6.5 g/dL (ref 6.0–8.3)

## 2020-10-25 LAB — BASIC METABOLIC PANEL
BUN: 20 mg/dL (ref 6–23)
CO2: 30 mEq/L (ref 19–32)
Calcium: 9.4 mg/dL (ref 8.4–10.5)
Chloride: 103 mEq/L (ref 96–112)
Creatinine, Ser: 1.27 mg/dL (ref 0.40–1.50)
GFR: 61.29 mL/min (ref >60.00)
Glucose, Bld: 98 mg/dL (ref 70–99)
Potassium: 4.5 mEq/L (ref 3.5–5.1)
Sodium: 139 mEq/L (ref 135–145)

## 2020-10-31 ENCOUNTER — Encounter: Payer: Self-pay | Admitting: Family Medicine

## 2020-10-31 ENCOUNTER — Other Ambulatory Visit: Payer: Self-pay

## 2020-10-31 ENCOUNTER — Ambulatory Visit (INDEPENDENT_AMBULATORY_CARE_PROVIDER_SITE_OTHER): Payer: BC Managed Care – PPO | Admitting: Family Medicine

## 2020-10-31 VITALS — BP 150/100 | HR 70 | Temp 98.2°F | Ht 66.0 in | Wt 197.0 lb

## 2020-10-31 DIAGNOSIS — Z79899 Other long term (current) drug therapy: Secondary | ICD-10-CM

## 2020-10-31 DIAGNOSIS — Z0000 Encounter for general adult medical examination without abnormal findings: Secondary | ICD-10-CM | POA: Diagnosis not present

## 2020-10-31 DIAGNOSIS — R972 Elevated prostate specific antigen [PSA]: Secondary | ICD-10-CM | POA: Diagnosis not present

## 2020-10-31 MED ORDER — HYDROCHLOROTHIAZIDE 12.5 MG PO TABS: 12.5000 mg | ORAL_TABLET | Freq: Every day | ORAL | 3 refills | Status: DC

## 2020-10-31 MED ORDER — CARVEDILOL 3.125 MG PO TABS: ORAL_TABLET | ORAL | 3 refills | Status: DC

## 2020-10-31 MED ORDER — AMITRIPTYLINE HCL 10 MG PO TABS: ORAL_TABLET | ORAL | 3 refills | Status: DC

## 2020-10-31 MED ORDER — TAMSULOSIN HCL 0.4 MG PO CAPS: 0.4000 mg | ORAL_CAPSULE | Freq: Every day | ORAL | 3 refills | Status: DC

## 2020-10-31 MED ORDER — LISINOPRIL 20 MG PO TABS: 20.0000 mg | ORAL_TABLET | Freq: Two times a day (BID) | ORAL | 3 refills | Status: DC

## 2020-10-31 MED ORDER — SILDENAFIL CITRATE 20 MG PO TABS: ORAL_TABLET | ORAL | 5 refills | Status: DC

## 2020-10-31 NOTE — Progress Notes (Signed)
[name], MD, CAQ Sports Medicine [facility] [address] [place], [id number]  Phone: [phone]  FAX: [phone]  Max Peters - 60 y.o. male  MRN [id number]  Date of Birth: 03/11/1961  Date: 10/31/2020  PCP: Hannah Beatopland, Jenah Vanasten, MD  Referral: Hannah Beatopland, Zakariah Urwin, MD  Chief Complaint  Patient presents with  . Annual Exam    This visit occurred during the SARS-CoV-2 public health emergency.  Safety protocols were in place, including screening questions prior to the visit, additional usage of staff PPE, and extensive cleaning of exam room while observing appropriate contact time as indicated for disinfecting solutions.   Patient Care Team: Hannah Beatopland, Lamin Chandley, MD as PCP - General (Family Medicine) Subjective:   Max Peters is a 60 y.o. pleasant patient who presents with the following:  Preventative Health Maintenance Visit:  Health Maintenance Summary Reviewed and updated, unless pt declines services.  Tobacco History Reviewed. Alcohol: No concerns, no excessive use Exercise Habits: Doing a lot of cardio and wights three times a week STD concerns: no risk or activity to increase risk Drug Use: None  HTN: Tolerating all medications without side effects Stable and at goal No CP, no sob. No HA.  BP Readings from Last 3 Encounters:  10/31/20 (!) 150/100  10/29/19 120/74  10/15/18 (!) 140/100    Basic Metabolic Panel:    Component Value Date/Time   NA 139 10/25/2020 1145   K 4.5 10/25/2020 1145   CL 103 10/25/2020 1145   CO2 30 10/25/2020 1145   BUN 20 10/25/2020 1145   CREATININE 1.27 10/25/2020 1145   GLUCOSE 98 10/25/2020 1145   CALCIUM 9.4 10/25/2020 1145     Shingrix  2014 has been with same girlfriend.  -  Libido is much less On precursors with high psa now Off of test  Wt Readings from Last 3 Encounters:  10/31/20 197 lb (89.4 kg)  10/29/19 200 lb (90.7 kg)  10/15/18 199 lb (90.3 kg)     Health  Maintenance  Topic Date Due  . Pneumococcal Vaccine 50-61 Years old (1 - PCV) Never done  . Zoster Vaccines- Shingrix (1 of 2) Never done  . COVID-19 Vaccine (4 - Booster for Pfizer series) 05/16/2020  . INFLUENZA VACCINE  10/17/2020  . COLONOSCOPY (Pts 45-1329yrs Insurance coverage will need to be confirmed)  06/02/2023  . TETANUS/TDAP  03/15/2025  . Hepatitis C Screening  Completed  . HIV Screening  Completed  . HPV VACCINES  Aged Out   Immunization History  Administered Date(s) Administered  . Influenza,inj,Quad PF,6+ Mos 01/07/2013, 03/16/2015  . Influenza-Unspecified 01/08/2014  . PFIZER(Purple Top)SARS-COV-2 Vaccination 06/05/2019, 07/01/2019, 02/16/2020  . Tdap 03/16/2015   Patient Active Problem List   Diagnosis Date Noted  . Leg edema, left 06/08/2015  . Chronic kidney disease (CKD), stage III (moderate) (HCC) 01/11/2014  . Hypogonadism in male 07/14/2012  . BPH without obstruction/lower urinary tract symptoms 07/14/2012  . ED (erectile dysfunction) of organic origin 01/15/2012  . Insomnia 11/07/2010  . ALLERGIC RHINITIS, CHRONIC 03/23/2010  . Essential hypertension, benign 11/16/2008    Past Medical History:  Diagnosis Date  . Chronic kidney disease (CKD), stage III (moderate) (HCC) 01/11/2014  . Hypertension   . Hypogonadism male     Past Surgical History:  Procedure Laterality Date  . SHOULDER SURGERY      Family History  Problem Relation Age of Onset  . Colon cancer Neg Hx   . Kidney cancer  Neg Hx   . Prostate cancer Neg Hx   . Bladder Cancer Neg Hx     Past Medical History, Surgical History, Social History, Family History, Problem List, Medications, and Allergies have been reviewed and updated if relevant.  Review of Systems: Pertinent positives are listed above.  Otherwise, a full 14 point review of systems has been done in full and it is negative except where it is noted positive.  Objective:   BP (!) 150/100   Pulse 70   Temp 98.2 F (36.8 C)  (Temporal)   Ht 5\' 6"  (1.676 m)   Wt 197 lb (89.4 kg)   SpO2 96%   BMI 31.80 kg/m  Ideal Body Weight: Weight in (lb) to have BMI = 25: 154.6  Ideal Body Weight: Weight in (lb) to have BMI = 25: 154.6 No results found. Depression screen [facility] 2/9 10/31/2020 10/29/2019 10/15/2018 06/13/2017  Decreased Interest 0 0 0 0  Down, Depressed, Hopeless 0 0 0 0  PHQ - 2 Score 0 0 0 0  Altered sleeping - 0 - -  Tired, decreased energy - 0 - -  Change in appetite - 0 - -  Feeling bad or failure about yourself  - 0 - -  Trouble concentrating - 0 - -  Moving slowly or fidgety/restless - 0 - -  Suicidal thoughts - 0 - -  PHQ-9 Score - 0 - -  Difficult doing work/chores - Not difficult at all - -     GEN: well developed, well nourished, no acute distress Eyes: conjunctiva and lids normal, PERRLA, EOMI ENT: TM clear, nares clear, oral exam WNL Neck: supple, no lymphadenopathy, no thyromegaly, no JVD Pulm: clear to auscultation and percussion, respiratory effort normal CV: regular rate and rhythm, S1-S2, no murmur, rub or gallop, no bruits, peripheral pulses normal and symmetric, no cyanosis, clubbing, edema or varicosities GI: soft, non-tender; no hepatosplenomegaly, masses; active bowel sounds all quadrants GU: deferred Lymph: no cervical, axillary or inguinal adenopathy MSK: gait normal, muscle tone and strength WNL, no joint swelling, effusions, discoloration, crepitus  SKIN: clear, good turgor, color WNL, no rashes, lesions, or ulcerations Neuro: normal mental status, normal strength, sensation, and motion Psych: alert; oriented to person, place and time, normally interactive and not anxious or depressed in appearance.  All labs reviewed with patient. Results for orders placed or performed in visit on 10/25/20  Lipid panel  Result Value Ref Range   Cholesterol 122 0 - 200 mg/dL   Triglycerides 12/25/20 0.0 - 149.0 mg/dL   HDL 62.6 94.85 mg/dL   VLDL >46.27 0.0 - 03.5 mg/dL   LDL Cholesterol  43 0 - 99 mg/dL   Total CHOL/HDL Ratio 2    NonHDL 54.70   Hepatic function panel  Result Value Ref Range   Total Bilirubin 0.5 0.2 - 1.2 mg/dL   Bilirubin, Direct 0.1 0.0 - 0.3 mg/dL   Alkaline Phosphatase 59 39 - 117 U/L   AST 18 0 - 37 U/L   ALT 17 0 - 53 U/L   Total Protein 6.5 6.0 - 8.3 g/dL   Albumin 4.2 3.5 - 5.2 g/dL  Basic metabolic panel  Result Value Ref Range   Sodium 139 135 - 145 mEq/L   Potassium 4.5 3.5 - 5.1 mEq/L   Chloride 103 96 - 112 mEq/L   CO2 30 19 - 32 mEq/L   Glucose, Bld 98 70 - 99 mg/dL   BUN 20 6 - 23 mg/dL   Creatinine,  Ser 1.27 0.40 - 1.50 mg/dL   GFR 22.97 >98.92 mL/min   Calcium 9.4 8.4 - 10.5 mg/dL  CBC with Differential/Platelet  Result Value Ref Range   WBC 3.4 (L) 4.0 - 10.5 K/uL   RBC 4.33 4.22 - 5.81 Mil/uL   Hemoglobin 13.9 13.0 - 17.0 g/dL   HCT 11.9 41.7 - 40.8 %   MCV 96.4 78.0 - 100.0 fl   MCHC 33.4 30.0 - 36.0 g/dL   RDW 14.4 81.8 - 56.3 %   Platelets 234.0 150.0 - 400.0 K/uL   Neutrophils Relative % 41.3 (L) 43.0 - 77.0 %   Lymphocytes Relative 37.6 12.0 - 46.0 %   Monocytes Relative 13.5 (H) 3.0 - 12.0 %   Eosinophils Relative 6.8 (H) 0.0 - 5.0 %   Basophils Relative 0.8 0.0 - 3.0 %   Neutro Abs 1.4 1.4 - 7.7 K/uL   Lymphs Abs 1.3 0.7 - 4.0 K/uL   Monocytes Absolute 0.5 0.1 - 1.0 K/uL   Eosinophils Absolute 0.2 0.0 - 0.7 K/uL   Basophils Absolute 0.0 0.0 - 0.1 K/uL  PSA, Total with Reflex to PSA, Free  Result Value Ref Range   PSA, Total 4.7 (H) < OR = 4.0 ng/mL  reflex PSA, Free  Result Value Ref Range   PSA, Free 0.5 ng/mL   PSA, % Free 11 (L) >25 % (calc)    Assessment and Plan:     ICD-10-CM   1. Healthcare maintenance  Z00.00     2. Elevated PSA  R97.20 PSA, Total with Reflex to PSA, Free    3. Encounter for long-term (current) use of medications  Z79.899 Basic metabolic panel     He continues to do well from an overall fitness and wellbeing standpoint along with diet.  Does have some issues with  libido, and he has been able to work around this with some sildenafil.  Previously when he was on testosterone supplementation his PSA did go higher.  He is on some oral supplementation now all of hormonal precursors, and he does also have an escalation of his PSA today.  He is going to stop this and we will recheck it in about 6 weeks.  Add hydrochlorothiazide.  BP not controlled today.  Health Maintenance Exam: The patient's preventative maintenance and recommended screening tests for an annual wellness exam were reviewed in full today. Brought up to date unless services declined.  Counselled on the importance of diet, exercise, and its role in overall health and mortality. The patient's FH and SH was reviewed, including their home life, tobacco status, and drug and alcohol status.  Follow-up in 1 year for physical exam or additional follow-up below.  Follow-up: Return in about 6 weeks (around 12/12/2020) for for lab draw only. Or follow-up in 1 year if not noted.  Meds ordered this encounter  Medications  . lisinopril (ZESTRIL) 20 MG tablet    Sig: Take 1 tablet (20 mg total) by mouth 2 (two) times daily.    Dispense:  180 tablet    Refill:  3  . carvedilol (COREG) 3.125 MG tablet    Sig: 1 po bid    Dispense:  180 tablet    Refill:  3  . hydrochlorothiazide (HYDRODIURIL) 12.5 MG tablet    Sig: Take 1 tablet (12.5 mg total) by mouth daily.    Dispense:  90 tablet    Refill:  3  . sildenafil (REVATIO) 20 MG tablet    Sig: TAKE 2-5 TABLETS  BY MOUTH EVERY DAY AS NEEDED    Dispense:  90 tablet    Refill:  5  . tamsulosin (FLOMAX) 0.4 MG CAPS capsule    Sig: Take 1 capsule (0.4 mg total) by mouth daily.    Dispense:  90 capsule    Refill:  3  . amitriptyline (ELAVIL) 10 MG tablet    Sig: TAKE 1 TO 2 TABLETS AT BEDTIME AS NEEDEDFOR SLEEP    Dispense:  180 tablet    Refill:  3    Medications Discontinued During This Encounter  Medication Reason  . azithromycin (ZITHROMAX) 250  MG tablet Completed Course  . lisinopril (ZESTRIL) 20 MG tablet   . carvedilol (COREG) 3.125 MG tablet Reorder  . amitriptyline (ELAVIL) 10 MG tablet Reorder  . sildenafil (REVATIO) 20 MG tablet Reorder  . tamsulosin (FLOMAX) 0.4 MG CAPS capsule Reorder   Orders Placed This Encounter  Procedures  . Basic metabolic panel  . PSA, Total with Reflex to PSA, Free     Signed,  Rooney Swails T. Oliviarose Punch, MD   Allergies as of 10/31/2020   No Known Allergies      Medication List        Accurate as of October 31, 2020  2:06 PM. If you have any questions, ask your nurse or doctor.          STOP taking these medications    azithromycin 250 MG tablet Commonly known as: ZITHROMAX Stopped by: Hannah Beat, MD       TAKE these medications    amitriptyline 10 MG tablet Commonly known as: ELAVIL TAKE 1 TO 2 TABLETS AT BEDTIME AS NEEDEDFOR SLEEP   azelastine 0.1 % nasal spray Commonly known as: ASTELIN SPRAY INTO EACH NOSTRIL TWICE DAILY   carvedilol 3.125 MG tablet Commonly known as: COREG 1 po bid What changed: See the new instructions. Changed by: Hannah Beat, MD   guaiFENesin-codeine 100-10 MG/5ML syrup Commonly known as: ROBITUSSIN AC Take 5-10 mLs by mouth at bedtime as needed for cough.   hydrochlorothiazide 12.5 MG tablet Commonly known as: HYDRODIURIL Take 1 tablet (12.5 mg total) by mouth daily. Started by: Hannah Beat, MD   lisinopril 20 MG tablet Commonly known as: ZESTRIL Take 1 tablet (20 mg total) by mouth 2 (two) times daily.   sildenafil 20 MG tablet Commonly known as: REVATIO TAKE 2-5 TABLETS BY MOUTH EVERY DAY AS NEEDED   tamsulosin 0.4 MG Caps capsule Commonly known as: FLOMAX Take 1 capsule (0.4 mg total) by mouth daily.   traZODone 100 MG tablet Commonly known as: DESYREL TAKE ONE TABLET AT BEDTIME

## 2020-11-06 LAB — REFLEX PSA, FREE
PSA, % Free: 11 % (calc) — ABNORMAL LOW (ref >25)
PSA, Free: 0.5 ng/mL

## 2020-11-06 LAB — TESTOS,TOTAL,FREE AND SHBG (FEMALE)
Free Testosterone: 58.1 pg/mL (ref 35.0–155.0)
Sex Hormone Binding: 39 nmol/L (ref 22–77)
Testosterone, Total, LC-MS-MS: 332 ng/dL (ref 250–1100)

## 2020-11-06 LAB — PSA, TOTAL WITH REFLEX TO PSA, FREE: PSA, Total: 4.7 ng/mL — ABNORMAL HIGH (ref <=4.0)

## 2020-12-13 ENCOUNTER — Other Ambulatory Visit: Payer: BC Managed Care – PPO

## 2020-12-13 ENCOUNTER — Other Ambulatory Visit: Payer: Self-pay

## 2020-12-13 ENCOUNTER — Other Ambulatory Visit (INDEPENDENT_AMBULATORY_CARE_PROVIDER_SITE_OTHER): Payer: BC Managed Care – PPO

## 2020-12-13 DIAGNOSIS — R972 Elevated prostate specific antigen [PSA]: Secondary | ICD-10-CM | POA: Diagnosis not present

## 2020-12-13 DIAGNOSIS — Z79899 Other long term (current) drug therapy: Secondary | ICD-10-CM

## 2020-12-13 LAB — BASIC METABOLIC PANEL
BUN: 30 mg/dL — ABNORMAL HIGH (ref 6–23)
CO2: 31 mEq/L (ref 19–32)
Calcium: 9.4 mg/dL (ref 8.4–10.5)
Chloride: 100 mEq/L (ref 96–112)
Creatinine, Ser: 1.36 mg/dL (ref 0.40–1.50)
GFR: 56.4 mL/min — ABNORMAL LOW (ref >60.00)
Glucose, Bld: 84 mg/dL (ref 70–99)
Potassium: 3.8 mEq/L (ref 3.5–5.1)
Sodium: 138 mEq/L (ref 135–145)

## 2020-12-14 LAB — REFLEX PSA, FREE
PSA, % Free: 13 % (calc) — ABNORMAL LOW (ref >25)
PSA, Free: 0.7 ng/mL

## 2020-12-14 LAB — PSA, TOTAL WITH REFLEX TO PSA, FREE: PSA, Total: 5.5 ng/mL — ABNORMAL HIGH (ref <=4.0)

## 2020-12-15 DIAGNOSIS — R972 Elevated prostate specific antigen [PSA]: Secondary | ICD-10-CM

## 2021-02-06 DIAGNOSIS — R3911 Hesitancy of micturition: Secondary | ICD-10-CM | POA: Diagnosis not present

## 2021-02-06 DIAGNOSIS — N401 Enlarged prostate with lower urinary tract symptoms: Secondary | ICD-10-CM | POA: Diagnosis not present

## 2021-02-06 DIAGNOSIS — R972 Elevated prostate specific antigen [PSA]: Secondary | ICD-10-CM | POA: Diagnosis not present

## 2021-03-06 ENCOUNTER — Other Ambulatory Visit: Payer: Self-pay | Admitting: Family Medicine

## 2021-03-07 NOTE — Telephone Encounter (Signed)
Last office visit 10/31/2020 for CPE.  Indomethacin not on current medication list. No future appointments.  Refill?

## 2021-05-18 ENCOUNTER — Other Ambulatory Visit: Payer: Self-pay | Admitting: Family Medicine

## 2021-05-19 DIAGNOSIS — R059 Cough, unspecified: Secondary | ICD-10-CM | POA: Diagnosis not present

## 2021-07-31 DIAGNOSIS — N400 Enlarged prostate without lower urinary tract symptoms: Secondary | ICD-10-CM | POA: Diagnosis not present

## 2021-08-07 DIAGNOSIS — R972 Elevated prostate specific antigen [PSA]: Secondary | ICD-10-CM | POA: Diagnosis not present

## 2021-08-07 DIAGNOSIS — N400 Enlarged prostate without lower urinary tract symptoms: Secondary | ICD-10-CM | POA: Diagnosis not present

## 2021-08-21 ENCOUNTER — Other Ambulatory Visit: Payer: Self-pay | Admitting: Family Medicine

## 2021-10-11 ENCOUNTER — Other Ambulatory Visit: Payer: Self-pay | Admitting: Family Medicine

## 2021-10-12 NOTE — Telephone Encounter (Signed)
LVM for patient to give us a call back to schedule.  

## 2021-10-12 NOTE — Telephone Encounter (Signed)
Please schedule CPE with fasting labs prior with Dr. Patsy Lager for sometime after 10/31/21.

## 2021-10-31 ENCOUNTER — Other Ambulatory Visit: Payer: Self-pay | Admitting: Family Medicine

## 2021-10-31 DIAGNOSIS — R6882 Decreased libido: Secondary | ICD-10-CM

## 2021-10-31 DIAGNOSIS — Z131 Encounter for screening for diabetes mellitus: Secondary | ICD-10-CM

## 2021-10-31 DIAGNOSIS — R5383 Other fatigue: Secondary | ICD-10-CM

## 2021-10-31 DIAGNOSIS — Z79899 Other long term (current) drug therapy: Secondary | ICD-10-CM

## 2021-10-31 DIAGNOSIS — Z13220 Encounter for screening for lipoid disorders: Secondary | ICD-10-CM

## 2021-11-02 ENCOUNTER — Other Ambulatory Visit (INDEPENDENT_AMBULATORY_CARE_PROVIDER_SITE_OTHER): Payer: BC Managed Care – PPO

## 2021-11-02 DIAGNOSIS — R5383 Other fatigue: Secondary | ICD-10-CM | POA: Diagnosis not present

## 2021-11-02 DIAGNOSIS — R6882 Decreased libido: Secondary | ICD-10-CM

## 2021-11-02 DIAGNOSIS — Z131 Encounter for screening for diabetes mellitus: Secondary | ICD-10-CM | POA: Diagnosis not present

## 2021-11-02 DIAGNOSIS — Z13220 Encounter for screening for lipoid disorders: Secondary | ICD-10-CM | POA: Diagnosis not present

## 2021-11-02 DIAGNOSIS — Z79899 Other long term (current) drug therapy: Secondary | ICD-10-CM | POA: Diagnosis not present

## 2021-11-02 LAB — CBC WITH DIFFERENTIAL/PLATELET
Basophils Absolute: 0 10*3/uL (ref 0.0–0.1)
Basophils Relative: 0.6 % (ref 0.0–3.0)
Eosinophils Absolute: 0.3 10*3/uL (ref 0.0–0.7)
Eosinophils Relative: 6.5 % — ABNORMAL HIGH (ref 0.0–5.0)
HCT: 41.6 % (ref 39.0–52.0)
Hemoglobin: 14.1 g/dL (ref 13.0–17.0)
Lymphocytes Relative: 33.7 % (ref 12.0–46.0)
Lymphs Abs: 1.3 10*3/uL (ref 0.7–4.0)
MCHC: 34 g/dL (ref 30.0–36.0)
MCV: 96.1 fl (ref 78.0–100.0)
Monocytes Absolute: 0.5 10*3/uL (ref 0.1–1.0)
Monocytes Relative: 13.8 % — ABNORMAL HIGH (ref 3.0–12.0)
Neutro Abs: 1.8 10*3/uL (ref 1.4–7.7)
Neutrophils Relative %: 45.4 % (ref 43.0–77.0)
Platelets: 223 10*3/uL (ref 150.0–400.0)
RBC: 4.32 Mil/uL (ref 4.22–5.81)
RDW: 14.5 % (ref 11.5–15.5)
WBC: 3.9 10*3/uL — ABNORMAL LOW (ref 4.0–10.5)

## 2021-11-02 LAB — HEMOGLOBIN A1C: Hgb A1c MFr Bld: 6.1 % (ref 4.6–6.5)

## 2021-11-02 LAB — HEPATIC FUNCTION PANEL
ALT: 18 U/L (ref 0–53)
AST: 28 U/L (ref 0–37)
Albumin: 4.3 g/dL (ref 3.5–5.2)
Alkaline Phosphatase: 48 U/L (ref 39–117)
Bilirubin, Direct: 0.1 mg/dL (ref 0.0–0.3)
Total Bilirubin: 0.5 mg/dL (ref 0.2–1.2)
Total Protein: 7.1 g/dL (ref 6.0–8.3)

## 2021-11-02 LAB — LIPID PANEL
Cholesterol: 113 mg/dL (ref 0–200)
HDL: 46.7 mg/dL (ref >39.00)
LDL Cholesterol: 51 mg/dL (ref 0–99)
NonHDL: 65.8
Total CHOL/HDL Ratio: 2
Triglycerides: 74 mg/dL (ref 0.0–149.0)
VLDL: 14.8 mg/dL (ref 0.0–40.0)

## 2021-11-02 LAB — BASIC METABOLIC PANEL
BUN: 29 mg/dL — ABNORMAL HIGH (ref 6–23)
CO2: 30 mEq/L (ref 19–32)
Calcium: 9.4 mg/dL (ref 8.4–10.5)
Chloride: 100 mEq/L (ref 96–112)
Creatinine, Ser: 1.33 mg/dL (ref 0.40–1.50)
GFR: 57.57 mL/min — ABNORMAL LOW (ref >60.00)
Glucose, Bld: 98 mg/dL (ref 70–99)
Potassium: 4.2 mEq/L (ref 3.5–5.1)
Sodium: 138 mEq/L (ref 135–145)

## 2021-11-07 NOTE — Progress Notes (Unsigned)
Max Crusoe T. Merced Brougham, MD, CAQ Sports Medicine [facility] at [facility] [address] [place], [address]  Phone: [phone]  FAX: [phone]  Max Peters - 61 y.o. male  MRN [id number]  Date of Birth: [birth year]  Date: 11/09/2021  PCP: Max Beat, MD  Referral: Max Beat, MD  No chief complaint on file.  Patient Care Team: Max Beat, MD as PCP - General (Family Medicine) Subjective:   Max Peters is a 61 y.o. pleasant patient who presents with the following:  Preventative Health Maintenance Visit:  Health Maintenance Summary Reviewed and updated, unless pt declines services.  Tobacco History Reviewed. Alcohol: No concerns, no excessive use Exercise Habits: Some activity, rec at least 30 mins 5 times a week STD concerns: no risk or activity to increase risk Drug Use: None  Shingrix Flu Covid bivalent  Health Maintenance  Topic Date Due   Zoster Vaccines- Shingrix (1 of 2) Never done   COVID-19 Vaccine (4 - Pfizer risk series) 04/12/2020   INFLUENZA VACCINE  10/17/2021   COLONOSCOPY (Pts 45-34yrs Insurance coverage will need to be confirmed)  06/02/2023   TETANUS/TDAP  03/15/2025   Hepatitis C Screening  Completed   HIV Screening  Completed   HPV VACCINES  Aged Out   Immunization History  Administered Date(s) Administered   Influenza,inj,Quad PF,6+ Mos 01/07/2013, 03/16/2015   Influenza-Unspecified 01/08/2014   PFIZER(Purple Top)SARS-COV-2 Vaccination 06/05/2019, 07/01/2019, 02/16/2020   Tdap 03/16/2015   Patient Active Problem List   Diagnosis Date Noted   Chronic kidney disease (CKD), stage III (moderate) (HCC) 01/11/2014    Priority: Medium    Essential hypertension, benign 11/16/2008    Priority: Medium    Hypogonadism in male 07/14/2012    Priority: Low   Insomnia 11/07/2010    Priority: Low   ALLERGIC RHINITIS, CHRONIC 03/23/2010    Priority: Low   BPH without obstruction/lower urinary tract  symptoms 07/14/2012   ED (erectile dysfunction) of organic origin 01/15/2012    Past Medical History:  Diagnosis Date   Chronic kidney disease (CKD), stage III (moderate) (HCC) 01/11/2014   Hypertension    Hypogonadism male     Past Surgical History:  Procedure Laterality Date   SHOULDER SURGERY      Family History  Problem Relation Age of Onset   Colon cancer Neg Hx    Kidney cancer Neg Hx    Prostate cancer Neg Hx    Bladder Cancer Neg Hx     Social History   Social History Narrative   Regular exercise--yes    Past Medical History, Surgical History, Social History, Family History, Problem List, Medications, and Allergies have been reviewed and updated if relevant.  Review of Systems: Pertinent positives are listed above.  Otherwise, a full 14 point review of systems has been done in full and it is negative except where it is noted positive.  Objective:   There were no vitals taken for this visit. Ideal Body Weight:    Ideal Body Weight:   No results found.    10/31/2020    8:11 AM 10/29/2019    9:09 AM 10/15/2018    8:27 AM 06/13/2017    8:59 AM  Depression screen PHQ 2/9  Decreased Interest 0 0 0 0  Down, Depressed, Hopeless 0 0 0 0  PHQ - 2 Score 0 0 0 0  Altered sleeping  0    Tired, decreased energy  0    Change in appetite  0  Feeling bad or failure about yourself   0    Trouble concentrating  0    Moving slowly or fidgety/restless  0    Suicidal thoughts  0    PHQ-9 Score  0    Difficult doing work/chores  Not difficult at all       GEN: well developed, well nourished, no acute distress Eyes: conjunctiva and lids normal, PERRLA, EOMI ENT: TM clear, nares clear, oral exam WNL Neck: supple, no lymphadenopathy, no thyromegaly, no JVD Pulm: clear to auscultation and percussion, respiratory effort normal CV: regular rate and rhythm, S1-S2, no murmur, rub or gallop, no bruits, peripheral pulses normal and symmetric, no cyanosis, clubbing, edema or  varicosities GI: soft, non-tender; no hepatosplenomegaly, masses; active bowel sounds all quadrants GU: deferred Lymph: no cervical, axillary or inguinal adenopathy MSK: gait normal, muscle tone and strength WNL, no joint swelling, effusions, discoloration, crepitus  SKIN: clear, good turgor, color WNL, no rashes, lesions, or ulcerations Neuro: normal mental status, normal strength, sensation, and motion Psych: alert; oriented to person, place and time, normally interactive and not anxious or depressed in appearance.  All labs reviewed with patient. Results for orders placed or performed in visit on 11/02/21  Hemoglobin A1c  Result Value Ref Range   Hgb A1c MFr Bld 6.1 4.6 - 6.5 %  CBC with Differential/Platelet  Result Value Ref Range   WBC 3.9 (L) 4.0 - 10.5 K/uL   RBC 4.32 4.22 - 5.81 Mil/uL   Hemoglobin 14.1 13.0 - 17.0 g/dL   HCT 87.8 67.6 - 72.0 %   MCV 96.1 78.0 - 100.0 fl   MCHC 34.0 30.0 - 36.0 g/dL   RDW 94.7 09.6 - 28.3 %   Platelets 223.0 150.0 - 400.0 K/uL   Neutrophils Relative % 45.4 43.0 - 77.0 %   Lymphocytes Relative 33.7 12.0 - 46.0 %   Monocytes Relative 13.8 (H) 3.0 - 12.0 %   Eosinophils Relative 6.5 (H) 0.0 - 5.0 %   Basophils Relative 0.6 0.0 - 3.0 %   Neutro Abs 1.8 1.4 - 7.7 K/uL   Lymphs Abs 1.3 0.7 - 4.0 K/uL   Monocytes Absolute 0.5 0.1 - 1.0 K/uL   Eosinophils Absolute 0.3 0.0 - 0.7 K/uL   Basophils Absolute 0.0 0.0 - 0.1 K/uL  Basic metabolic panel  Result Value Ref Range   Sodium 138 135 - 145 mEq/L   Potassium 4.2 3.5 - 5.1 mEq/L   Chloride 100 96 - 112 mEq/L   CO2 30 19 - 32 mEq/L   Glucose, Bld 98 70 - 99 mg/dL   BUN 29 (H) 6 - 23 mg/dL   Creatinine, Ser 6.62 0.40 - 1.50 mg/dL   GFR 94.76 (L) >54.65 mL/min   Calcium 9.4 8.4 - 10.5 mg/dL  Lipid panel  Result Value Ref Range   Cholesterol 113 0 - 200 mg/dL   Triglycerides 03.5 0.0 - 149.0 mg/dL   HDL 46.56 >81.27 mg/dL   VLDL 51.7 0.0 - 00.1 mg/dL   LDL Cholesterol 51 0 - 99 mg/dL    Total CHOL/HDL Ratio 2    NonHDL 65.80   Hepatic function panel  Result Value Ref Range   Total Bilirubin 0.5 0.2 - 1.2 mg/dL   Bilirubin, Direct 0.1 0.0 - 0.3 mg/dL   Alkaline Phosphatase 48 39 - 117 U/L   AST 28 0 - 37 U/L   ALT 18 0 - 53 U/L   Total Protein 7.1 6.0 - 8.3 g/dL  Albumin 4.3 3.5 - 5.2 g/dL    Assessment and Plan:     ICD-10-CM   1. Healthcare maintenance  Z00.00       Health Maintenance Exam: The patient's preventative maintenance and recommended screening tests for an annual wellness exam were reviewed in full today. Brought up to date unless services declined.  Counselled on the importance of diet, exercise, and its role in overall health and mortality. The patient's FH and SH was reviewed, including their home life, tobacco status, and drug and alcohol status.  Follow-up in 1 year for physical exam or additional follow-up below.  Follow-up: No follow-ups on file. Or follow-up in 1 year if not noted.  No orders of the defined types were placed in this encounter.  There are no discontinued medications. No orders of the defined types were placed in this encounter.   Signed,  Elpidio Galea. Berlyn Saylor, MD   Allergies as of 11/09/2021   No Known Allergies      Medication List        Accurate as of November 07, 2021  3:44 PM. If you have any questions, ask your nurse or doctor.          amitriptyline 10 MG tablet Commonly known as: ELAVIL TAKE 1 TO 2 TABLETS AT BEDTIME AS NEEDEDFOR SLEEP   azelastine 0.1 % nasal spray Commonly known as: ASTELIN SPRAY INTO EACH NOSTRIL TWICE DAILY   carvedilol 3.125 MG tablet Commonly known as: COREG 1 po bid   guaiFENesin-codeine 100-10 MG/5ML syrup Commonly known as: ROBITUSSIN AC Take 5-10 mLs by mouth at bedtime as needed for cough.   hydrochlorothiazide 12.5 MG tablet Commonly known as: HYDRODIURIL TAKE 1 TABLET BY MOUTH ONCE DAILY   indomethacin 50 MG capsule Commonly known as: INDOCIN TAKE 1  CAPSULE BY MOUTH 3 TIMES DAILY WITH FOOD   lisinopril 20 MG tablet Commonly known as: ZESTRIL Take 1 tablet (20 mg total) by mouth 2 (two) times daily.   sildenafil 20 MG tablet Commonly known as: REVATIO TAKE 2-5 TABLETS BY MOUTH EVERY DAY AS NEEDED   tamsulosin 0.4 MG Caps capsule Commonly known as: FLOMAX Take 1 capsule (0.4 mg total) by mouth daily.   traZODone 100 MG tablet Commonly known as: DESYREL TAKE ONE TABLET AT BEDTIME

## 2021-11-09 ENCOUNTER — Ambulatory Visit (INDEPENDENT_AMBULATORY_CARE_PROVIDER_SITE_OTHER): Payer: BC Managed Care – PPO | Admitting: Family Medicine

## 2021-11-09 ENCOUNTER — Other Ambulatory Visit: Payer: Self-pay | Admitting: Family Medicine

## 2021-11-09 ENCOUNTER — Encounter: Payer: Self-pay | Admitting: Family Medicine

## 2021-11-09 VITALS — BP 100/70 | HR 73 | Temp 98.2°F | Ht 65.5 in | Wt 194.2 lb

## 2021-11-09 DIAGNOSIS — Z23 Encounter for immunization: Secondary | ICD-10-CM

## 2021-11-09 DIAGNOSIS — Z0000 Encounter for general adult medical examination without abnormal findings: Secondary | ICD-10-CM

## 2021-11-10 LAB — TESTOS,TOTAL,FREE AND SHBG (FEMALE)
Free Testosterone: 64.5 pg/mL (ref 35.0–155.0)
Sex Hormone Binding: 25 nmol/L (ref 22–77)
Testosterone, Total, LC-MS-MS: 307 ng/dL (ref 250–1100)

## 2021-11-16 ENCOUNTER — Other Ambulatory Visit: Payer: Self-pay | Admitting: Family Medicine

## 2021-12-25 DIAGNOSIS — H1033 Unspecified acute conjunctivitis, bilateral: Secondary | ICD-10-CM | POA: Diagnosis not present

## 2021-12-27 ENCOUNTER — Other Ambulatory Visit: Payer: Self-pay | Admitting: Family Medicine

## 2022-01-04 ENCOUNTER — Other Ambulatory Visit: Payer: Self-pay | Admitting: Family Medicine

## 2022-01-06 ENCOUNTER — Other Ambulatory Visit: Payer: Self-pay | Admitting: Family Medicine

## 2022-01-09 ENCOUNTER — Ambulatory Visit (INDEPENDENT_AMBULATORY_CARE_PROVIDER_SITE_OTHER): Payer: BC Managed Care – PPO

## 2022-01-09 DIAGNOSIS — Z23 Encounter for immunization: Secondary | ICD-10-CM | POA: Diagnosis not present

## 2022-01-09 NOTE — Progress Notes (Signed)
Per orders of Dr. Diona Browner, in the absence of Dr. Lorelei Pont, 2nd injection of shingrix given by Loreen Freud. Patient tolerated injection well.

## 2022-01-11 ENCOUNTER — Other Ambulatory Visit: Payer: Self-pay | Admitting: Family Medicine

## 2022-01-29 DIAGNOSIS — N400 Enlarged prostate without lower urinary tract symptoms: Secondary | ICD-10-CM | POA: Diagnosis not present

## 2022-01-29 DIAGNOSIS — I10 Essential (primary) hypertension: Secondary | ICD-10-CM | POA: Diagnosis not present

## 2022-01-29 DIAGNOSIS — R300 Dysuria: Secondary | ICD-10-CM | POA: Diagnosis not present

## 2022-02-16 DIAGNOSIS — R3911 Hesitancy of micturition: Secondary | ICD-10-CM | POA: Diagnosis not present

## 2022-02-16 DIAGNOSIS — N401 Enlarged prostate with lower urinary tract symptoms: Secondary | ICD-10-CM | POA: Diagnosis not present

## 2022-02-16 DIAGNOSIS — R972 Elevated prostate specific antigen [PSA]: Secondary | ICD-10-CM | POA: Diagnosis not present

## 2022-05-14 ENCOUNTER — Other Ambulatory Visit: Payer: Self-pay | Admitting: *Deleted

## 2022-05-14 MED ORDER — SILDENAFIL CITRATE 20 MG PO TABS: ORAL_TABLET | ORAL | 5 refills | Status: DC

## 2022-05-14 NOTE — Telephone Encounter (Signed)
Rx faxed to [facility] at [phone].

## 2022-08-09 ENCOUNTER — Other Ambulatory Visit: Payer: Self-pay | Admitting: Family Medicine

## 2022-08-15 DIAGNOSIS — R972 Elevated prostate specific antigen [PSA]: Secondary | ICD-10-CM | POA: Diagnosis not present

## 2022-08-22 DIAGNOSIS — N5201 Erectile dysfunction due to arterial insufficiency: Secondary | ICD-10-CM | POA: Diagnosis not present

## 2022-08-22 DIAGNOSIS — R972 Elevated prostate specific antigen [PSA]: Secondary | ICD-10-CM | POA: Diagnosis not present

## 2022-08-22 DIAGNOSIS — R3911 Hesitancy of micturition: Secondary | ICD-10-CM | POA: Diagnosis not present

## 2022-08-22 DIAGNOSIS — N401 Enlarged prostate with lower urinary tract symptoms: Secondary | ICD-10-CM | POA: Diagnosis not present

## 2022-10-13 ENCOUNTER — Other Ambulatory Visit: Payer: Self-pay | Admitting: Family Medicine

## 2022-10-15 NOTE — Telephone Encounter (Signed)
LVMTCB

## 2022-10-15 NOTE — Telephone Encounter (Signed)
Please schedule CPE with fasting labs prior with Dr. Patsy Lager after 11/10/22.

## 2022-11-09 ENCOUNTER — Other Ambulatory Visit: Payer: Self-pay | Admitting: Family Medicine

## 2022-11-29 ENCOUNTER — Other Ambulatory Visit: Payer: Self-pay | Admitting: Family Medicine

## 2022-12-10 ENCOUNTER — Telehealth: Payer: Self-pay | Admitting: *Deleted

## 2022-12-10 DIAGNOSIS — R6882 Decreased libido: Secondary | ICD-10-CM

## 2022-12-10 DIAGNOSIS — N400 Enlarged prostate without lower urinary tract symptoms: Secondary | ICD-10-CM

## 2022-12-10 DIAGNOSIS — Z131 Encounter for screening for diabetes mellitus: Secondary | ICD-10-CM

## 2022-12-10 DIAGNOSIS — R972 Elevated prostate specific antigen [PSA]: Secondary | ICD-10-CM

## 2022-12-10 DIAGNOSIS — Z79899 Other long term (current) drug therapy: Secondary | ICD-10-CM

## 2022-12-10 DIAGNOSIS — Z13220 Encounter for screening for lipoid disorders: Secondary | ICD-10-CM

## 2022-12-10 DIAGNOSIS — R5383 Other fatigue: Secondary | ICD-10-CM

## 2022-12-10 NOTE — Telephone Encounter (Signed)
-----   Message from Alvina Chou sent at 12/10/2022 10:17 AM EDT ----- Regarding: Lab orders for Thu, 10.10.24 Patient is scheduled for CPX labs, please order future labs, Thanks , Camelia Eng

## 2022-12-27 ENCOUNTER — Other Ambulatory Visit (INDEPENDENT_AMBULATORY_CARE_PROVIDER_SITE_OTHER): Payer: BC Managed Care – PPO

## 2022-12-27 DIAGNOSIS — R972 Elevated prostate specific antigen [PSA]: Secondary | ICD-10-CM

## 2022-12-27 DIAGNOSIS — Z79899 Other long term (current) drug therapy: Secondary | ICD-10-CM

## 2022-12-27 DIAGNOSIS — R5383 Other fatigue: Secondary | ICD-10-CM

## 2022-12-27 DIAGNOSIS — N400 Enlarged prostate without lower urinary tract symptoms: Secondary | ICD-10-CM

## 2022-12-27 DIAGNOSIS — Z131 Encounter for screening for diabetes mellitus: Secondary | ICD-10-CM

## 2022-12-27 DIAGNOSIS — R6882 Decreased libido: Secondary | ICD-10-CM

## 2022-12-27 DIAGNOSIS — Z13220 Encounter for screening for lipoid disorders: Secondary | ICD-10-CM

## 2022-12-27 LAB — BASIC METABOLIC PANEL
BUN: 28 mg/dL — ABNORMAL HIGH (ref 6–23)
CO2: 29 meq/L (ref 19–32)
Calcium: 9.6 mg/dL (ref 8.4–10.5)
Chloride: 101 meq/L (ref 96–112)
Creatinine, Ser: 1.31 mg/dL (ref 0.40–1.50)
GFR: 58.16 mL/min — ABNORMAL LOW (ref >60.00)
Glucose, Bld: 103 mg/dL — ABNORMAL HIGH (ref 70–99)
Potassium: 4 meq/L (ref 3.5–5.1)
Sodium: 137 meq/L (ref 135–145)

## 2022-12-27 LAB — CBC WITH DIFFERENTIAL/PLATELET
Basophils Absolute: 0 10*3/uL (ref 0.0–0.1)
Basophils Relative: 0.5 % (ref 0.0–3.0)
Eosinophils Absolute: 0.3 10*3/uL (ref 0.0–0.7)
Eosinophils Relative: 6 % — ABNORMAL HIGH (ref 0.0–5.0)
HCT: 41.8 % (ref 39.0–52.0)
Hemoglobin: 14 g/dL (ref 13.0–17.0)
Lymphocytes Relative: 36.1 % (ref 12.0–46.0)
Lymphs Abs: 1.6 10*3/uL (ref 0.7–4.0)
MCHC: 33.4 g/dL (ref 30.0–36.0)
MCV: 96.7 fL (ref 78.0–100.0)
Monocytes Absolute: 0.7 10*3/uL (ref 0.1–1.0)
Monocytes Relative: 14.4 % — ABNORMAL HIGH (ref 3.0–12.0)
Neutro Abs: 1.9 10*3/uL (ref 1.4–7.7)
Neutrophils Relative %: 43 % (ref 43.0–77.0)
Platelets: 215 10*3/uL (ref 150.0–400.0)
RBC: 4.32 Mil/uL (ref 4.22–5.81)
RDW: 13.7 % (ref 11.5–15.5)
WBC: 4.5 10*3/uL (ref 4.0–10.5)

## 2022-12-27 LAB — LIPID PANEL
Cholesterol: 115 mg/dL (ref 0–200)
HDL: 49.6 mg/dL (ref >39.00)
LDL Cholesterol: 53 mg/dL (ref 0–99)
NonHDL: 65.66
Total CHOL/HDL Ratio: 2
Triglycerides: 65 mg/dL (ref 0.0–149.0)
VLDL: 13 mg/dL (ref 0.0–40.0)

## 2022-12-27 LAB — HEPATIC FUNCTION PANEL
ALT: 18 U/L (ref 0–53)
AST: 22 U/L (ref 0–37)
Albumin: 4.2 g/dL (ref 3.5–5.2)
Alkaline Phosphatase: 60 U/L (ref 39–117)
Bilirubin, Direct: 0 mg/dL (ref 0.0–0.3)
Total Bilirubin: 0.3 mg/dL (ref 0.2–1.2)
Total Protein: 6.5 g/dL (ref 6.0–8.3)

## 2022-12-27 LAB — HEMOGLOBIN A1C: Hgb A1c MFr Bld: 6 % (ref 4.6–6.5)

## 2023-01-01 NOTE — Progress Notes (Unsigned)
Asaf Elmquist T. Lovena Kluck, MD, CAQ Sports Medicine [facility] at [facility] [address] [place], [address]  Phone: [phone]  FAX: [phone]  JOSEALBERTO MONTALTO - 62 y.o. male  MRN [id number]  Date of Birth: [birth year]  Date: 01/03/2023  PCP: Hannah Beat, MD  Referral: Hannah Beat, MD  No chief complaint on file.  Patient Care Team: Hannah Beat, MD as PCP - General (Family Medicine) Subjective:   SHAMEL GERMOND is a 62 y.o. pleasant patient who presents with the following:  Preventative Health Maintenance Visit:  Health Maintenance Summary Reviewed and updated, unless pt declines services.  Tobacco History Reviewed. Alcohol: No concerns, no excessive use Exercise Habits: Some activity, rec at least 30 mins 5 times a week STD concerns: no risk or activity to increase risk Drug Use: None  Deforest is a generally healthy gentleman, and I have known him for many years.  He essentially only comes in when he has a general health maintenance exam.  The last time I saw him his blood sugars were in the prediabetic range. He also has some chronic kidney disease stage III that we have been monitoring over time.    Health Maintenance  Topic Date Due   INFLUENZA VACCINE  10/18/2022   COVID-19 Vaccine (4 - 2023-24 season) 11/18/2022   Colonoscopy  06/02/2023   DTaP/Tdap/Td (2 - Td or Tdap) 03/15/2025   Hepatitis C Screening  Completed   HIV Screening  Completed   Zoster Vaccines- Shingrix  Completed   HPV VACCINES  Aged Out   Immunization History  Administered Date(s) Administered   Influenza,inj,Quad PF,6+ Mos 01/07/2013, 03/16/2015   Influenza-Unspecified 01/08/2014   PFIZER(Purple Top)SARS-COV-2 Vaccination 06/05/2019, 07/01/2019, 02/16/2020   Tdap 03/16/2015   Zoster Recombinant(Shingrix) 11/09/2021, 01/09/2022   Patient Active Problem List   Diagnosis Date Noted   Chronic kidney disease (CKD), stage III (moderate) (HCC) 01/11/2014     Priority: Medium    Essential hypertension, benign 11/16/2008    Priority: Medium    Hypogonadism in male 07/14/2012    Priority: Low   Insomnia 11/07/2010    Priority: Low   Allergic rhinitis 03/23/2010    Priority: Low   BPH without obstruction/lower urinary tract symptoms 07/14/2012   ED (erectile dysfunction) of organic origin 01/15/2012    Past Medical History:  Diagnosis Date   Chronic kidney disease (CKD), stage III (moderate) (HCC) 01/11/2014   Hypertension    Hypogonadism male     Past Surgical History:  Procedure Laterality Date   SHOULDER SURGERY      Family History  Problem Relation Age of Onset   Colon cancer Neg Hx    Kidney cancer Neg Hx    Prostate cancer Neg Hx    Bladder Cancer Neg Hx     Social History   Social History Narrative   Regular exercise--yes    Past Medical History, Surgical History, Social History, Family History, Problem List, Medications, and Allergies have been reviewed and updated if relevant.  Review of Systems: Pertinent positives are listed above.  Otherwise, a full 14 point review of systems has been done in full and it is negative except where it is noted positive.  Objective:   There were no vitals taken for this visit. Ideal Body Weight:    Ideal Body Weight:   No results found.    11/09/2021    9:04 AM 10/31/2020    8:11 AM 10/29/2019    9:09 AM 10/15/2018  8:27 AM 06/13/2017    8:59 AM  Depression screen PHQ 2/9  Decreased Interest 0 0 0 0 0  Down, Depressed, Hopeless 0 0 0 0 0  PHQ - 2 Score 0 0 0 0 0  Altered sleeping   0    Tired, decreased energy   0    Change in appetite   0    Feeling bad or failure about yourself    0    Trouble concentrating   0    Moving slowly or fidgety/restless   0    Suicidal thoughts   0    PHQ-9 Score   0    Difficult doing work/chores   Not difficult at all       GEN: well developed, well nourished, no acute distress Eyes: conjunctiva and lids normal, PERRLA,  EOMI ENT: TM clear, nares clear, oral exam WNL Neck: supple, no lymphadenopathy, no thyromegaly, no JVD Pulm: clear to auscultation and percussion, respiratory effort normal CV: regular rate and rhythm, S1-S2, no murmur, rub or gallop, no bruits, peripheral pulses normal and symmetric, no cyanosis, clubbing, edema or varicosities GI: soft, non-tender; no hepatosplenomegaly, masses; active bowel sounds all quadrants GU: deferred Lymph: no cervical, axillary or inguinal adenopathy MSK: gait normal, muscle tone and strength WNL, no joint swelling, effusions, discoloration, crepitus  SKIN: clear, good turgor, color WNL, no rashes, lesions, or ulcerations Neuro: normal mental status, normal strength, sensation, and motion Psych: alert; oriented to person, place and time, normally interactive and not anxious or depressed in appearance.  All labs reviewed with patient. Results for orders placed or performed in visit on 12/27/22  PSA, Total with Reflex to PSA, Free  Result Value Ref Range   PSA, Total 4.9 (H) < OR = 4.0 ng/mL  Hemoglobin A1c  Result Value Ref Range   Hgb A1c MFr Bld 6.0 4.6 - 6.5 %  Basic metabolic panel  Result Value Ref Range   Sodium 137 135 - 145 mEq/L   Potassium 4.0 3.5 - 5.1 mEq/L   Chloride 101 96 - 112 mEq/L   CO2 29 19 - 32 mEq/L   Glucose, Bld 103 (H) 70 - 99 mg/dL   BUN 28 (H) 6 - 23 mg/dL   Creatinine, Ser 4.09 0.40 - 1.50 mg/dL   GFR 81.19 (L) >14.78 mL/min   Calcium 9.6 8.4 - 10.5 mg/dL  Lipid panel  Result Value Ref Range   Cholesterol 115 0 - 200 mg/dL   Triglycerides 29.5 0.0 - 149.0 mg/dL   HDL 62.13 >08.65 mg/dL   VLDL 78.4 0.0 - 69.6 mg/dL   LDL Cholesterol 53 0 - 99 mg/dL   Total CHOL/HDL Ratio 2    NonHDL 65.66   CBC with Differential/Platelet  Result Value Ref Range   WBC 4.5 4.0 - 10.5 K/uL   RBC 4.32 4.22 - 5.81 Mil/uL   Hemoglobin 14.0 13.0 - 17.0 g/dL   HCT 29.5 28.4 - 13.2 %   MCV 96.7 78.0 - 100.0 fl   MCHC 33.4 30.0 - 36.0 g/dL    RDW 44.0 10.2 - 72.5 %   Platelets 215.0 150.0 - 400.0 K/uL   Neutrophils Relative % 43.0 43.0 - 77.0 %   Lymphocytes Relative 36.1 12.0 - 46.0 %   Monocytes Relative 14.4 (H) 3.0 - 12.0 %   Eosinophils Relative 6.0 (H) 0.0 - 5.0 %   Basophils Relative 0.5 0.0 - 3.0 %   Neutro Abs 1.9 1.4 - 7.7 K/uL  Lymphs Abs 1.6 0.7 - 4.0 K/uL   Monocytes Absolute 0.7 0.1 - 1.0 K/uL   Eosinophils Absolute 0.3 0.0 - 0.7 K/uL   Basophils Absolute 0.0 0.0 - 0.1 K/uL  Hepatic Function Panel  Result Value Ref Range   Total Bilirubin 0.3 0.2 - 1.2 mg/dL   Bilirubin, Direct 0.0 0.0 - 0.3 mg/dL   Alkaline Phosphatase 60 39 - 117 U/L   AST 22 0 - 37 U/L   ALT 18 0 - 53 U/L   Total Protein 6.5 6.0 - 8.3 g/dL   Albumin 4.2 3.5 - 5.2 g/dL  reflex PSA, Free  Result Value Ref Range   PSA, Free 0.6 ng/mL   PSA, % Free 12 (L) >25 % (calc)    Assessment and Plan:     ICD-10-CM   1. Healthcare maintenance  Z00.00       Health Maintenance Exam: The patient's preventative maintenance and recommended screening tests for an annual wellness exam were reviewed in full today. Brought up to date unless services declined.  Counselled on the importance of diet, exercise, and its role in overall health and mortality. The patient's FH and SH was reviewed, including their home life, tobacco status, and drug and alcohol status.  Follow-up in 1 year for physical exam or additional follow-up below.  Disposition: No follow-ups on file.  No orders of the defined types were placed in this encounter.  There are no discontinued medications. No orders of the defined types were placed in this encounter.   Signed,  Elpidio Galea. Shernell Saldierna, MD   Allergies as of 01/03/2023   No Known Allergies      Medication List        Accurate as of January 01, 2023  9:35 AM. If you have any questions, ask your nurse or doctor.          amitriptyline 10 MG tablet Commonly known as: ELAVIL TAKE 1-2 TABLETS BY MOUTH AT  BEDTIME AS NEEDED FOR SLEEP   azelastine 0.1 % nasal spray Commonly known as: ASTELIN SPRAY INTO EACH NOSTRIL TWICE DAILY   carvedilol 3.125 MG tablet Commonly known as: COREG TAKE 1 TABLET BY MOUTH TWICE DAILY   hydrochlorothiazide 12.5 MG tablet Commonly known as: HYDRODIURIL TAKE 1 TABLET BY MOUTH ONCE DAILY   indomethacin 50 MG capsule Commonly known as: INDOCIN TAKE 1 CAPSULE BY MOUTH 3 TIMES DAILY WITH FOOD   lisinopril 20 MG tablet Commonly known as: ZESTRIL TAKE 1 TABLET BY MOUTH TWICE DAILY   sildenafil 20 MG tablet Commonly known as: REVATIO TAKE 2-5 TABLETS BY MOUTH EVERY DAY AS NEEDED   tamsulosin 0.4 MG Caps capsule Commonly known as: FLOMAX TAKE ONE CAPSULE DAILY 30 MINUTES AFTER THE SAME MEAL EACH DAY   traZODone 100 MG tablet Commonly known as: DESYREL TAKE ONE TABLET AT BEDTIME

## 2023-01-02 LAB — TESTOS,TOTAL,FREE AND SHBG (FEMALE)
Free Testosterone: 59.3 pg/mL (ref 35.0–155.0)
Sex Hormone Binding: 47.5 nmol/L (ref 22–77)
Testosterone, Total, LC-MS-MS: 389 ng/dL (ref 250–1100)

## 2023-01-02 LAB — PSA, TOTAL WITH REFLEX TO PSA, FREE: PSA, Total: 4.9 ng/mL — ABNORMAL HIGH (ref <=4.0)

## 2023-01-02 LAB — REFLEX PSA, FREE
PSA, % Free: 12 % — ABNORMAL LOW (ref >25)
PSA, Free: 0.6 ng/mL

## 2023-01-03 ENCOUNTER — Ambulatory Visit (INDEPENDENT_AMBULATORY_CARE_PROVIDER_SITE_OTHER): Payer: BC Managed Care – PPO | Admitting: Family Medicine

## 2023-01-03 ENCOUNTER — Encounter: Payer: Self-pay | Admitting: Family Medicine

## 2023-01-03 VITALS — BP 110/80 | HR 71 | Temp 98.9°F | Ht 65.75 in | Wt 198.1 lb

## 2023-01-03 DIAGNOSIS — Z0000 Encounter for general adult medical examination without abnormal findings: Secondary | ICD-10-CM | POA: Diagnosis not present

## 2023-01-14 ENCOUNTER — Other Ambulatory Visit: Payer: Self-pay | Admitting: Family Medicine

## 2023-01-16 ENCOUNTER — Other Ambulatory Visit: Payer: Self-pay | Admitting: Family Medicine

## 2023-02-06 ENCOUNTER — Other Ambulatory Visit: Payer: Self-pay | Admitting: Family Medicine

## 2023-02-11 DIAGNOSIS — R972 Elevated prostate specific antigen [PSA]: Secondary | ICD-10-CM | POA: Diagnosis not present

## 2023-02-12 ENCOUNTER — Other Ambulatory Visit: Payer: Self-pay | Admitting: Family Medicine

## 2023-02-21 DIAGNOSIS — N5201 Erectile dysfunction due to arterial insufficiency: Secondary | ICD-10-CM | POA: Diagnosis not present

## 2023-02-21 DIAGNOSIS — R3911 Hesitancy of micturition: Secondary | ICD-10-CM | POA: Diagnosis not present

## 2023-02-21 DIAGNOSIS — N401 Enlarged prostate with lower urinary tract symptoms: Secondary | ICD-10-CM | POA: Diagnosis not present

## 2023-02-21 DIAGNOSIS — R972 Elevated prostate specific antigen [PSA]: Secondary | ICD-10-CM | POA: Diagnosis not present

## 2023-03-05 ENCOUNTER — Other Ambulatory Visit: Payer: Self-pay | Admitting: Family Medicine

## 2023-06-24 ENCOUNTER — Encounter: Payer: Self-pay | Admitting: Internal Medicine

## 2023-08-07 ENCOUNTER — Other Ambulatory Visit: Payer: Self-pay | Admitting: Family Medicine

## 2023-08-21 DIAGNOSIS — R972 Elevated prostate specific antigen [PSA]: Secondary | ICD-10-CM | POA: Diagnosis not present

## 2023-08-28 DIAGNOSIS — R972 Elevated prostate specific antigen [PSA]: Secondary | ICD-10-CM | POA: Diagnosis not present

## 2023-08-28 DIAGNOSIS — R3911 Hesitancy of micturition: Secondary | ICD-10-CM | POA: Diagnosis not present

## 2023-08-28 DIAGNOSIS — N401 Enlarged prostate with lower urinary tract symptoms: Secondary | ICD-10-CM | POA: Diagnosis not present

## 2023-08-29 ENCOUNTER — Other Ambulatory Visit: Payer: Self-pay | Admitting: Urology

## 2023-08-29 DIAGNOSIS — R3911 Hesitancy of micturition: Secondary | ICD-10-CM

## 2023-08-29 DIAGNOSIS — N401 Enlarged prostate with lower urinary tract symptoms: Secondary | ICD-10-CM

## 2023-08-29 DIAGNOSIS — R972 Elevated prostate specific antigen [PSA]: Secondary | ICD-10-CM

## 2023-08-30 ENCOUNTER — Encounter: Payer: Self-pay | Admitting: Urology

## 2023-09-13 ENCOUNTER — Ambulatory Visit
Admission: RE | Admit: 2023-09-13 | Discharge: 2023-09-13 | Disposition: A | Discharge status: Home / Self Care | Source: Ambulatory Visit | Attending: Urology | Admitting: Urology

## 2023-09-13 DIAGNOSIS — N138 Other obstructive and reflux uropathy: Secondary | ICD-10-CM

## 2023-09-13 DIAGNOSIS — R3911 Hesitancy of micturition: Secondary | ICD-10-CM

## 2023-09-13 DIAGNOSIS — R972 Elevated prostate specific antigen [PSA]: Secondary | ICD-10-CM

## 2023-09-13 MED ORDER — GADOPICLENOL 0.5 MMOL/ML IV SOLN: 10.0000 mL | Freq: Once | INTRAVENOUS | Status: DC | PRN

## 2023-10-11 ENCOUNTER — Ambulatory Visit: Admitting: Family Medicine

## 2023-10-11 ENCOUNTER — Encounter: Payer: Self-pay | Admitting: Family Medicine

## 2023-10-11 VITALS — BP 144/94 | HR 67 | Temp 98.6°F | Ht 65.75 in | Wt 207.0 lb

## 2023-10-11 DIAGNOSIS — I10 Essential (primary) hypertension: Secondary | ICD-10-CM | POA: Diagnosis not present

## 2023-10-11 DIAGNOSIS — F410 Panic disorder [episodic paroxysmal anxiety] without agoraphobia: Secondary | ICD-10-CM | POA: Diagnosis not present

## 2023-10-11 DIAGNOSIS — F411 Generalized anxiety disorder: Secondary | ICD-10-CM | POA: Diagnosis not present

## 2023-10-11 DIAGNOSIS — F5104 Psychophysiologic insomnia: Secondary | ICD-10-CM

## 2023-10-11 MED ORDER — DIAZEPAM 2 MG PO TABS: 2.0000 mg | ORAL_TABLET | Freq: Four times a day (QID) | ORAL | 0 refills | Status: DC | PRN

## 2023-10-11 MED ORDER — BUSPIRONE HCL 7.5 MG PO TABS: ORAL_TABLET | ORAL | 1 refills | Status: DC

## 2023-10-11 NOTE — Assessment & Plan Note (Signed)
 Elevated in office today in setting of discussing stressful subject. Improved on recheck but still elevated. I did ask him to buy BP cuff to monitor readings at home, notify us  if persistently elevated >140/90

## 2023-10-11 NOTE — Assessment & Plan Note (Signed)
 Episode during recent MRI - that has exacerbated general anxiety symptoms. Start buspar as per above. Discussed pros and cons of controlled substances. Discussed recommended short term use of med. Discussed risks of medication including dependence, tolerance, and addiction/abuse potential.  ERx valium for upcoming MRI, and may use sparingly PRN anxiety attacks while buspar takes effect.

## 2023-10-11 NOTE — Patient Instructions (Addendum)
 Work on healthy stress relieving strategies - such as physical activity and spending time doing things you enjoy.   Bedtime routine checklist: 1. Avoid naps during the day 2. Avoid stimulants such as caffeine and nicotine. Avoid bedtime alcohol (it can speed onset of sleep but the body's metabolism can cause awakenings). 3. All forms of exercise help ensure sound sleep - limit vigorous exercise to morning or late afternoon 4. Avoid food too close to bedtime including chocolate (which contains caffeine) 5. Soak up natural light 6. Establish regular bedtime routine. 7. Associate bed with sleep - avoid TV, computer or phone, reading while in bed. 8. Ensure pleasant, relaxing sleep environment - quiet, dark, cool room.   May use valium 2mg  1-2 as needed for MRI May try this as needed for anxiety as well.  Stop amitriptyline .  Start buspar anxiety medicine 7.5mg  nightly for 3 days then increase to 7.5mg  twice daily.  Continue trazodone  100mg  nightly  Return in 4-6 weeks for follow up with Dr Watt

## 2023-10-11 NOTE — Assessment & Plan Note (Signed)
 Longstanding, managed with amitriptyline  10-20mg  nightly with trazodone  100mg  nightly. Recently regimen becoming ineffective. Reviewed sleep hygiene measures, handout provided.  Stop amitriptyline , start buspar as per above, with PRN valium 2mg .  Sounds like insomnia is brought on by anxiety. Treat anxiety and monitor for improvement in insomnia.

## 2023-10-11 NOTE — Progress Notes (Signed)
 Ph: [phone] Fax: [phone]   Patient ID: Max Peters, male    DOB: 19-Nov-1960, 63 y.o.   MRN: [id number]  This visit was conducted in person.  BP (!) 144/94 (BP Location: Right Arm, Cuff Size: Large)   Pulse 67   Temp 98.6 F (37 C) (Oral)   Ht 5' 5.75 (1.67 m)   Wt 207 lb (93.9 kg)   SpO2 97%   BMI 33.67 kg/m   BP Readings from Last 3 Encounters:  10/11/23 (!) 144/94  01/03/23 110/80  11/09/21 100/70   CC: discuss anxiety/sleep  Subjective:   HPI: Max Peters is a 63 y.o. male presenting on 10/11/2023 for Medical Management of Chronic Issues (Anxiety )   Patient of Dr Eleanore here to discuss worsening anxiety.  Notes 11 yr h/o insomnia managed with amitriptyline  10-20mg  at bedtime and 100mg  trazodone  for sleep due to longstanding night time anxiety that started after anxiety attack. No daytime anxiety symptoms previously. Notes both trouble falling and staying asleep.   Pending prostate MRI next month for increasing PSA. Followed by urology Dr Nieves. Initially ordered 09/13/2023 - however he was unable to complete due to claustrophobia/anxiety.   Since this episode, notes increased general anxiety including some daytime symptoms. Also attributes concerns over prostate, as well as increased work stressors. Most nights since then sleeping in recliner instead of in bed with wife. Can have recurring dream of drowning. Has tried wife's klonopin with benefit.   He asks for medication to use PRN anxiety.   No endorsed snoring, PNdyspnea, morning headaches, overall restorative sleep. + daytime somnolence.   HTN - managed with carvedilol  3.125mg  bid, hydrochlorothiazide  12.5mg  daily, lisinopril  20mg  bid.  Weight up 9 lbs since last visit.   [place] CSRS reviewed.      Relevant past medical, surgical, family and social history reviewed and updated as indicated. Interim medical history since our last visit reviewed. Allergies and medications reviewed and  updated. Outpatient Medications Prior to Visit  Medication Sig Dispense Refill   amitriptyline  (ELAVIL ) 10 MG tablet TAKE 1-2 TABLETS BY MOUTH AT BEDTIME AS NEEDED FOR SLEEP 180 tablet 3   carvedilol  (COREG ) 3.125 MG tablet TAKE 1 TABLET BY MOUTH TWICE DAILY 180 tablet 3   hydrochlorothiazide  (HYDRODIURIL ) 12.5 MG tablet TAKE 1 TABLET BY MOUTH ONCE DAILY 90 tablet 3   indomethacin (INDOCIN) 50 MG capsule Take 50 mg by mouth 3 (three) times daily as needed.     lisinopril  (ZESTRIL ) 20 MG tablet TAKE 1 TABLET BY MOUTH TWICE DAILY 180 tablet 3   tadalafil  (CIALIS ) 5 MG tablet Take 5 mg by mouth daily.     tamsulosin  (FLOMAX ) 0.4 MG CAPS capsule TAKE ONE CAPSULE DAILY 30 MINUTES AFTER THE SAME MEAL EACH DAY 90 capsule 3   traZODone  (DESYREL ) 100 MG tablet TAKE ONE TABLET AT BEDTIME 90 tablet 1   No facility-administered medications prior to visit.     Per HPI unless specifically indicated in ROS section below Review of Systems  Objective:  BP (!) 144/94 (BP Location: Right Arm, Cuff Size: Large)   Pulse 67   Temp 98.6 F (37 C) (Oral)   Ht 5' 5.75 (1.67 m)   Wt 207 lb (93.9 kg)   SpO2 97%   BMI 33.67 kg/m   Wt Readings from Last 3 Encounters:  10/11/23 207 lb (93.9 kg)  01/03/23 198 lb 2 oz (89.9 kg)  11/09/21 194 lb 4 oz (88.1 kg)  Physical Exam Vitals and nursing note reviewed.  Constitutional:      Appearance: Normal appearance. He is not ill-appearing.  HENT:     Mouth/Throat:     Mouth: Mucous membranes are moist.     Pharynx: Oropharynx is clear. No oropharyngeal exudate or posterior oropharyngeal erythema.  Eyes:     Conjunctiva/sclera: Conjunctivae normal.     Pupils: Pupils are equal, round, and reactive to light.  Neck:     Thyroid: No thyroid mass or thyromegaly.  Cardiovascular:     Rate and Rhythm: Normal rate and regular rhythm.     Pulses: Normal pulses.     Heart sounds: Normal heart sounds. No murmur heard. Pulmonary:     Effort: Pulmonary effort  is normal. No respiratory distress.     Breath sounds: Normal breath sounds. No wheezing, rhonchi or rales.  Musculoskeletal:     Cervical back: Normal range of motion and neck supple.     Right lower leg: No edema.     Left lower leg: No edema.  Skin:    General: Skin is warm and dry.     Findings: No rash.  Neurological:     Mental Status: He is alert.  Psychiatric:        Mood and Affect: Mood normal.        Behavior: Behavior normal.        Thought Content: Thought content normal.        Judgment: Judgment normal.          10/11/2023    2:24 PM 01/03/2023    9:01 AM 11/09/2021    9:04 AM 10/31/2020    8:11 AM 10/29/2019    9:09 AM  Depression screen PHQ 2/9  Decreased Interest 0 0 0 0 0  Down, Depressed, Hopeless 1 0 0 0 0  PHQ - 2 Score 1 0 0 0 0  Altered sleeping 3    0  Tired, decreased energy 1    0  Change in appetite 0    0  Feeling bad or failure about yourself  0    0  Trouble concentrating 0    0  Moving slowly or fidgety/restless 1    0  Suicidal thoughts 0    0  PHQ-9 Score 6    0  Difficult doing work/chores Somewhat difficult    Not difficult at all       10/11/2023    2:25 PM  GAD 7 : Generalized Anxiety Score  Nervous, Anxious, on Edge 1  Control/stop worrying 0  Worry too much - different things 0  Trouble relaxing 2  Restless 0  Easily annoyed or irritable 0  Afraid - awful might happen 0  Total GAD 7 Score 3  Anxiety Difficulty Somewhat difficult   Assessment & Plan:   Problem List Items Addressed This Visit     Essential hypertension, benign (Chronic)   Elevated in office today in setting of discussing stressful subject. Improved on recheck but still elevated. I did ask him to buy BP cuff to monitor readings at home, notify us  if persistently elevated >140/90      Chronic insomnia (Chronic)   Longstanding, managed with amitriptyline  10-20mg  nightly with trazodone  100mg  nightly. Recently regimen becoming ineffective. Reviewed sleep  hygiene measures, handout provided.  Stop amitriptyline , start buspar as per above, with PRN valium 2mg .  Sounds like insomnia is brought on by anxiety. Treat anxiety and monitor for improvement in insomnia.  Anxiety attack   Episode during recent MRI - that has exacerbated general anxiety symptoms. Start buspar as per above. Discussed pros and cons of controlled substances. Discussed recommended short term use of med. Discussed risks of medication including dependence, tolerance, and addiction/abuse potential.  ERx valium for upcoming MRI, and may use sparingly PRN anxiety attacks while buspar takes effect.      Relevant Medications   diazepam (VALIUM) 2 MG tablet   busPIRone (BUSPAR) 7.5 MG tablet   GAD (generalized anxiety disorder) - Primary   Longstanding night time anxiety contributing to insomnia.  Now worsening anxiety noted after anxiety attack during MRI, causing daytime symptoms as well.  Reviewed treatment of anxiety, encouraged regular exercise routine and other healthy stress relieving strategies.  Start buspar 7.5mg  nightly x3d then increase to 7.5mg  bid.  Stop amitriptyline . Ok to continue trazodone . Rx valium 2mg  PRN anxiety, PRN upcoming MRI.  Rec f/u with PCP in 6 wks.       Relevant Medications   diazepam (VALIUM) 2 MG tablet   busPIRone (BUSPAR) 7.5 MG tablet     Meds ordered this encounter  Medications   diazepam (VALIUM) 2 MG tablet    Sig: Take 1 tablet (2 mg total) by mouth every 6 (six) hours as needed for anxiety.    Dispense:  30 tablet    Refill:  0   busPIRone (BUSPAR) 7.5 MG tablet    Sig: Take 1 tablet (7.5 mg total) by mouth at bedtime for 3 days, THEN 1 tablet (7.5 mg total) 2 (two) times daily.    Dispense:  60 tablet    Refill:  1    No orders of the defined types were placed in this encounter.   Patient Instructions  Work on healthy stress relieving strategies - such as physical activity and spending time doing things you enjoy.    Bedtime routine checklist: 1. Avoid naps during the day 2. Avoid stimulants such as caffeine and nicotine. Avoid bedtime alcohol (it can speed onset of sleep but the body's metabolism can cause awakenings). 3. All forms of exercise help ensure sound sleep - limit vigorous exercise to morning or late afternoon 4. Avoid food too close to bedtime including chocolate (which contains caffeine) 5. Soak up natural light 6. Establish regular bedtime routine. 7. Associate bed with sleep - avoid TV, computer or phone, reading while in bed. 8. Ensure pleasant, relaxing sleep environment - quiet, dark, cool room.   May use valium 2mg  1-2 as needed for MRI May try this as needed for anxiety as well.  Stop amitriptyline .  Start buspar anxiety medicine 7.5mg  nightly for 3 days then increase to 7.5mg  twice daily.  Continue trazodone  100mg  nightly  Return in 4-6 weeks for follow up with Dr Watt  Follow up plan: Return in about 6 weeks (around 11/22/2023), or if symptoms worsen or fail to improve, for follow up visit.  Anton Blas, MD

## 2023-10-11 NOTE — Assessment & Plan Note (Addendum)
 Longstanding night time anxiety contributing to insomnia.  Now worsening anxiety noted after anxiety attack during MRI, causing daytime symptoms as well.  Reviewed treatment of anxiety, encouraged regular exercise routine and other healthy stress relieving strategies.  Start buspar 7.5mg  nightly x3d then increase to 7.5mg  bid.  Stop amitriptyline . Ok to continue trazodone . Rx valium 2mg  PRN anxiety, PRN upcoming MRI.  Rec f/u with PCP in 6 wks.

## 2023-10-18 ENCOUNTER — Ambulatory Visit: Payer: Self-pay

## 2023-10-18 NOTE — Telephone Encounter (Signed)
 Patient is scheduled to see Dr. Jimmy Monday

## 2023-10-18 NOTE — Telephone Encounter (Signed)
 FYI Only or Action Required?: Action required by provider: update on patient condition.  Patient was last seen in primary care on 10/11/2023 by Rilla Baller, MD.  Called Nurse Triage reporting No chief complaint on file..  Symptoms began several months ago.  Interventions attempted: Prescription medications: Buspar  and Trazodone .  Symptoms are: unchanged.  Triage Disposition: See PCP Within 2 Weeks  Patient/caregiver understands and will follow disposition?: Yes- scheduled to see Dr. Jimmy on 8/4. Pt says that the Buspar  is not working. He says that he has not tried to take the Valium  prescribed. This RN re-educated patient on physician instructions to take the Valium  PRN for anxiety.  Pt says that he will go ahead and try to incorporate the medication into his regimem, but also endorsed that he had better sleep when taking Amitryptilline and Trazadone together.  Pt asking if Valium  will be an ongoing part of managing his anxiety and if so, then he would likely need refills. Patient asking to hear from PCP regarding an alternative or complimentary medication over the weekend.   Copied from CRM [id number]. Topic: Clinical - Red Word Triage >> Oct 18, 2023  1:18 PM Donna BRAVO wrote: Red Word that prompted transfer to Nurse Triage: patient not sleeping, has night time anxiety, new medication not helping with sleep Reason for Disposition  Insomnia is a chronic symptom (recurrent or ongoing AND present > 4 weeks)  Answer Assessment - Initial Assessment Questions 1. DESCRIPTION: Tell me about your sleeping problem. (e.g., waking frequently during night, sleeping during day and awake at night, trouble falling asleep) How bad is it?      Has ttrouble falling and staying asleep. Feels anxious before going to bed. Starts in recliner and drifts off, tries to go to bed  2. ONSET: How long have you been having trouble sleeping? (e.g., days, weeks, months; longstanding sleep problems)      Longstanding sleep problems   3. DAYTIME SLEEP PATTERN: How much time do you spend sleeping or napping during the day?     No daytime sleeping  4. STRESSORS: Is there anything that is making you feel stressed? Is there something that worries you?     High stress job, thinks about work even when at home  5. PAIN: Do you have any pain that is keeping you awake? (e.g., back pain, joint pain) If Yes, ask How bad is the pain? (e.g., scale 0-10; mild, moderate, severe).     No pain. Has restless legs occasionally.   6. CAFFEINE: Do you drink caffeinated beverages? If Yes, ask How much each day? (e.g., coffee, tea, colas)     Not too much at night, usually during the day. But does not drink caffeine after 5pm.  7. ALCOHOL USE OR SUBSTANCE USE: Do you drink alcohol or use any substances?     No substances, but drinks alcohol occasiionally  8. MEDICINE CHANGE: Has there been any recent change in medicines? (e.g., new medicine started, stopped, or dose changed).      Yes, stopped Amitryptilline and started on Buspar   9. TREATMENT: What have you done so far to treat this sleep problem? (e.g., prescription or OTC sleep medicines, herbal or dietary supplements, cannabis, relaxation strategies)     No  10. OTHER SYMPTOMS: Do you have any other symptoms?  (e.g., difficulty breathing)       No  11. PREGNANCY: Is there any chance you are pregnant? When was your last menstrual period?  N/a  Protocols used: Insomnia-A-AH

## 2023-10-19 NOTE — Telephone Encounter (Signed)
 I will review alternatives for his therapy at the visit

## 2023-10-21 ENCOUNTER — Ambulatory Visit: Admitting: Family Medicine

## 2023-10-21 ENCOUNTER — Ambulatory Visit: Admitting: Internal Medicine

## 2023-10-21 ENCOUNTER — Encounter: Payer: Self-pay | Admitting: Family Medicine

## 2023-10-21 VITALS — BP 146/92 | HR 71 | Temp 97.7°F | Ht 65.75 in | Wt 202.1 lb

## 2023-10-21 DIAGNOSIS — F411 Generalized anxiety disorder: Secondary | ICD-10-CM

## 2023-10-21 DIAGNOSIS — F5104 Psychophysiologic insomnia: Secondary | ICD-10-CM

## 2023-10-21 DIAGNOSIS — F410 Panic disorder [episodic paroxysmal anxiety] without agoraphobia: Secondary | ICD-10-CM | POA: Diagnosis not present

## 2023-10-21 MED ORDER — AMITRIPTYLINE HCL 25 MG PO TABS: 25.0000 mg | ORAL_TABLET | Freq: Every day | ORAL | 3 refills | Status: DC

## 2023-10-21 NOTE — Progress Notes (Signed)
 Tawna Alwin T. Zachrey Deutscher, MD, CAQ Sports Medicine [facility] [address] [place], [address]  Phone: [phone]  FAX: [phone]  JAKYRIE TOTHEROW - 63 y.o. male  MRN [id number]  Date of Birth: Jun 14, 1960  Date: 10/21/2023  PCP: Watt Mirza, MD  Referral: Watt Mirza, MD  Chief Complaint  Patient presents with   Insomnia   Anxiety   Subjective:   Max Peters is a 63 y.o. very pleasant male patient with Body mass index is 32.87 kg/m. who presents with the following:  F/u GAD, panic attack, and insomnia.  I have known Daleon for many years.  He sometimes does have baseline anxiety and has nighttime insomnia and anxiety.  Prior to his panic attack when he was in the MRI machine, had been fairly well-controlled.  Now he is also worried about his prostate and possible prostate cancer.  Elavil  10-20 mg at bedtime - rarely taking 20 mg, will more stagger it, 10 mg and 10 mg Traz 100 mg at night  Loud MRI sound, got a severe panic attack Has it rescheduled for Friday  Saw my partner last week who gave him some BuSpar  to start, he does not think this is really helped at all.  He is not sleeping nearly all that well at nighttime.  Review of Systems is noted in the HPI, as appropriate  Objective:   Pulse 71   Temp 97.7 F (36.5 C) (Temporal)   Ht 5' 5.75 (1.67 m)   Wt 202 lb 2 oz (91.7 kg)   SpO2 98%   BMI 32.87 kg/m   GEN: No acute distress; alert,appropriate. PULM: Breathing comfortably in no respiratory distress PSYCH: Normally interactive.  Normal affect.  Laboratory and Imaging Data:  Assessment and Plan:     ICD-10-CM   1. GAD (generalized anxiety disorder)  F41.1     2. Anxiety attack  F41.0     3. Chronic insomnia  F51.04      Acutely worsened generalized anxiety in a setting of major panic attack last week.  Think that his anxiety and panic is heightened right now, in part that he is worried about his  prostate and also worried about having another panic attack at the MRI machine.  He is gena take Valium  2 mg 1 hour before procedure, then an additional 2 mg 30 minutes before the procedure.  I am going to have him increase his Elavil  dose to 25 mg scheduled at night 30 minutes before bed, and he can continue to use trazodone  if needed at nighttime, as well.  He has been to give me a MyChart message on Monday to let him know how he is doing.  I think we could also easily increase the Elavil  up to 35 or higher if needed.  Stop BuSpar .  We could consider this or a different medication if his generalized anxiety does not calm down after this acute event.  Medication Management during today's office visit: Meds ordered this encounter  Medications   amitriptyline  (ELAVIL ) 25 MG tablet    Sig: Take 1 tablet (25 mg total) by mouth at bedtime.    Dispense:  90 tablet    Refill:  3   Medications Discontinued During This Encounter  Medication Reason   busPIRone  (BUSPAR ) 7.5 MG tablet    amitriptyline  (ELAVIL ) 10 MG tablet     Orders placed today for conditions managed today: No orders of the defined types were placed  in this encounter.   Disposition: No follow-ups on file.  Dr[employer] One speech-to-text software was used for transcription in this dictation.  Possible transcriptional errors can occur using Animal nutritionist.   Signed,  Jacques DASEN. Kasha Howeth, MD   Outpatient Encounter Medications as of 10/21/2023  Medication Sig   amitriptyline  (ELAVIL ) 25 MG tablet Take 1 tablet (25 mg total) by mouth at bedtime.   carvedilol  (COREG ) 3.125 MG tablet TAKE 1 TABLET BY MOUTH TWICE DAILY   diazepam  (VALIUM ) 2 MG tablet Take 1 tablet (2 mg total) by mouth every 6 (six) hours as needed for anxiety.   hydrochlorothiazide  (HYDRODIURIL ) 12.5 MG tablet TAKE 1 TABLET BY MOUTH ONCE DAILY   indomethacin (INDOCIN) 50 MG capsule Take 50 mg by mouth 3 (three) times daily as needed.   lisinopril   (ZESTRIL ) 20 MG tablet TAKE 1 TABLET BY MOUTH TWICE DAILY   tadalafil  (CIALIS ) 5 MG tablet Take 5 mg by mouth daily.   tamsulosin  (FLOMAX ) 0.4 MG CAPS capsule TAKE ONE CAPSULE DAILY 30 MINUTES AFTER THE SAME MEAL EACH DAY   traZODone  (DESYREL ) 100 MG tablet TAKE ONE TABLET AT BEDTIME   [DISCONTINUED] busPIRone  (BUSPAR ) 7.5 MG tablet Take 1 tablet (7.5 mg total) by mouth at bedtime for 3 days, THEN 1 tablet (7.5 mg total) 2 (two) times daily.   [DISCONTINUED] amitriptyline  (ELAVIL ) 10 MG tablet TAKE 1-2 TABLETS BY MOUTH AT BEDTIME AS NEEDED FOR SLEEP (Patient not taking: Reported on 10/21/2023)   No facility-administered encounter medications on file as of 10/21/2023.

## 2023-10-21 NOTE — Telephone Encounter (Signed)
 I am going to see if we can get Max Peters to move over to my schedule.  I have known him 30 years and would rather manage mental health issues myself.

## 2023-10-21 NOTE — Patient Instructions (Signed)
 Start taking the Amitryptiline 25 mg scheduled about 30 minutes before bed  It is ok to take trazodone  100 mg at the same time

## 2023-10-25 ENCOUNTER — Ambulatory Visit: Admission: RE | Admit: 2023-10-25 | Source: Ambulatory Visit

## 2023-12-04 DIAGNOSIS — R972 Elevated prostate specific antigen [PSA]: Secondary | ICD-10-CM | POA: Diagnosis not present

## 2023-12-12 ENCOUNTER — Other Ambulatory Visit: Payer: Self-pay | Admitting: Urology

## 2023-12-12 ENCOUNTER — Other Ambulatory Visit (HOSPITAL_COMMUNITY): Payer: Self-pay | Admitting: Urology

## 2023-12-12 ENCOUNTER — Encounter (HOSPITAL_COMMUNITY): Payer: Self-pay | Admitting: Urology

## 2023-12-12 DIAGNOSIS — R972 Elevated prostate specific antigen [PSA]: Secondary | ICD-10-CM | POA: Diagnosis not present

## 2023-12-12 DIAGNOSIS — N401 Enlarged prostate with lower urinary tract symptoms: Secondary | ICD-10-CM | POA: Diagnosis not present

## 2023-12-12 DIAGNOSIS — R3121 Asymptomatic microscopic hematuria: Secondary | ICD-10-CM | POA: Diagnosis not present

## 2023-12-12 DIAGNOSIS — R3911 Hesitancy of micturition: Secondary | ICD-10-CM | POA: Diagnosis not present

## 2023-12-12 DIAGNOSIS — N138 Other obstructive and reflux uropathy: Secondary | ICD-10-CM

## 2023-12-17 ENCOUNTER — Telehealth: Payer: Self-pay | Admitting: *Deleted

## 2023-12-17 DIAGNOSIS — Z13220 Encounter for screening for lipoid disorders: Secondary | ICD-10-CM

## 2023-12-17 DIAGNOSIS — R5383 Other fatigue: Secondary | ICD-10-CM

## 2023-12-17 DIAGNOSIS — R6882 Decreased libido: Secondary | ICD-10-CM

## 2023-12-17 DIAGNOSIS — R972 Elevated prostate specific antigen [PSA]: Secondary | ICD-10-CM

## 2023-12-17 DIAGNOSIS — Z79899 Other long term (current) drug therapy: Secondary | ICD-10-CM

## 2023-12-17 DIAGNOSIS — N400 Enlarged prostate without lower urinary tract symptoms: Secondary | ICD-10-CM

## 2023-12-17 DIAGNOSIS — Z131 Encounter for screening for diabetes mellitus: Secondary | ICD-10-CM

## 2023-12-17 NOTE — Telephone Encounter (Signed)
-----   Message from Veva JINNY Ferrari sent at 12/17/2023 11:28 AM EDT ----- Regarding: Lab orders for Wed, 10.15.25 Patient is scheduled for CPX labs, please order future labs, Thanks , Veva

## 2024-01-01 ENCOUNTER — Other Ambulatory Visit (INDEPENDENT_AMBULATORY_CARE_PROVIDER_SITE_OTHER): Payer: BC Managed Care – PPO

## 2024-01-01 DIAGNOSIS — R972 Elevated prostate specific antigen [PSA]: Secondary | ICD-10-CM | POA: Diagnosis not present

## 2024-01-01 DIAGNOSIS — Z131 Encounter for screening for diabetes mellitus: Secondary | ICD-10-CM | POA: Diagnosis not present

## 2024-01-01 DIAGNOSIS — R5383 Other fatigue: Secondary | ICD-10-CM | POA: Diagnosis not present

## 2024-01-01 DIAGNOSIS — Z13220 Encounter for screening for lipoid disorders: Secondary | ICD-10-CM

## 2024-01-01 DIAGNOSIS — R6882 Decreased libido: Secondary | ICD-10-CM

## 2024-01-01 DIAGNOSIS — N400 Enlarged prostate without lower urinary tract symptoms: Secondary | ICD-10-CM | POA: Diagnosis not present

## 2024-01-01 DIAGNOSIS — Z79899 Other long term (current) drug therapy: Secondary | ICD-10-CM | POA: Diagnosis not present

## 2024-01-01 LAB — CBC WITH DIFFERENTIAL/PLATELET
Basophils Absolute: 0 K/uL (ref 0.0–0.1)
Basophils Relative: 0.3 % (ref 0.0–3.0)
Eosinophils Absolute: 0.2 K/uL (ref 0.0–0.7)
Eosinophils Relative: 3.7 % (ref 0.0–5.0)
HCT: 46 % (ref 39.0–52.0)
Hemoglobin: 15.4 g/dL (ref 13.0–17.0)
Lymphocytes Relative: 28.2 % (ref 12.0–46.0)
Lymphs Abs: 1.2 K/uL (ref 0.7–4.0)
MCHC: 33.4 g/dL (ref 30.0–36.0)
MCV: 95.7 fl (ref 78.0–100.0)
Monocytes Absolute: 0.6 K/uL (ref 0.1–1.0)
Monocytes Relative: 15.5 % — ABNORMAL HIGH (ref 3.0–12.0)
Neutro Abs: 2.2 K/uL (ref 1.4–7.7)
Neutrophils Relative %: 52.3 % (ref 43.0–77.0)
Platelets: 234 K/uL (ref 150.0–400.0)
RBC: 4.81 Mil/uL (ref 4.22–5.81)
RDW: 15.4 % (ref 11.5–15.5)
WBC: 4.2 K/uL (ref 4.0–10.5)

## 2024-01-01 LAB — BASIC METABOLIC PANEL WITH GFR
BUN: 31 mg/dL — ABNORMAL HIGH (ref 6–23)
CO2: 31 meq/L (ref 19–32)
Calcium: 8.9 mg/dL (ref 8.4–10.5)
Chloride: 98 meq/L (ref 96–112)
Creatinine, Ser: 1.63 mg/dL — ABNORMAL HIGH (ref 0.40–1.50)
GFR: 44.42 mL/min — ABNORMAL LOW (ref >60.00)
Glucose, Bld: 88 mg/dL (ref 70–99)
Potassium: 4.1 meq/L (ref 3.5–5.1)
Sodium: 136 meq/L (ref 135–145)

## 2024-01-01 LAB — HEPATIC FUNCTION PANEL
ALT: 27 U/L (ref 0–53)
AST: 38 U/L — ABNORMAL HIGH (ref 0–37)
Albumin: 4.2 g/dL (ref 3.5–5.2)
Alkaline Phosphatase: 52 U/L (ref 39–117)
Bilirubin, Direct: 0.1 mg/dL (ref 0.0–0.3)
Total Bilirubin: 0.5 mg/dL (ref 0.2–1.2)
Total Protein: 6.9 g/dL (ref 6.0–8.3)

## 2024-01-01 LAB — LIPID PANEL
Cholesterol: 97 mg/dL (ref 0–200)
HDL: 42.7 mg/dL (ref >39.00)
LDL Cholesterol: 40 mg/dL (ref 0–99)
NonHDL: 53.92
Total CHOL/HDL Ratio: 2
Triglycerides: 72 mg/dL (ref 0.0–149.0)
VLDL: 14.4 mg/dL (ref 0.0–40.0)

## 2024-01-01 LAB — HEMOGLOBIN A1C: Hgb A1c MFr Bld: 5.8 % (ref 4.6–6.5)

## 2024-01-03 LAB — REFLEX PSA, FREE
PSA, % Free: 11 % — ABNORMAL LOW (ref >25)
PSA, Free: 0.8 ng/mL

## 2024-01-03 LAB — PSA, TOTAL WITH REFLEX TO PSA, FREE: PSA, Total: 7.3 ng/mL — ABNORMAL HIGH (ref <=4.0)

## 2024-01-03 NOTE — Patient Instructions (Signed)
 SURGICAL WAITING ROOM VISITATION  Patients having surgery or a procedure may have no more than 2 support people in the waiting area - these visitors may rotate.    Children under the age of 21 must have an adult with them who is not the patient.  Visitors with respiratory illnesses are discouraged from visiting and should remain at home.  If the patient needs to stay at the hospital during part of their recovery, the visitor guidelines for inpatient rooms apply. Pre-op nurse will coordinate an appropriate time for 1 support person to accompany patient in pre-op.  This support person may not rotate.    Please refer to the [facility] website for the visitor guidelines for Inpatients (after your surgery is over and you are in a regular room).    Your procedure is scheduled on: 01/24/24   Report to [facility] Main Entrance    Report to admitting at 9:15 AM   Call this number if you have problems the morning of surgery [phone]   Do not eat food or drink liquids :After Midnight.          If you have questions, please contact your surgeon's office.   FOLLOW BOWEL PREP AND ANY ADDITIONAL PRE OP INSTRUCTIONS YOU RECEIVED FROM YOUR SURGEON'S OFFICE!!! Use fleet enema night before surgery.     Oral Hygiene is also important to reduce your risk of infection.                                    Remember - BRUSH YOUR TEETH THE MORNING OF SURGERY WITH YOUR REGULAR TOOTHPASTE  DENTURES WILL BE REMOVED PRIOR TO SURGERY PLEASE DO NOT APPLY Poly grip OR ADHESIVES!!!   Stop all vitamins and herbal supplements 7 days before surgery.   Take these medicines the morning of surgery with A SIP OF WATER: Carvedilol , Diazepam , Tamsulosin                               You may not have any metal on your body including jewelry, and body piercing             Do not wear lotions, powders, cologne, or deodorant              Men may shave face and neck.   Do not bring valuables to the  hospital. [facility] IS NOT             RESPONSIBLE   FOR VALUABLES.   Contacts, glasses, dentures or bridgework may not be worn into surgery.  DO NOT BRING YOUR HOME MEDICATIONS TO THE HOSPITAL. PHARMACY WILL DISPENSE MEDICATIONS LISTED ON YOUR MEDICATION LIST TO YOU DURING YOUR ADMISSION IN THE HOSPITAL!    Patients discharged on the day of surgery will not be allowed to drive home.  Someone NEEDS to stay with you for the first 24 hours after anesthesia.              Please read over the following fact sheets you were given: IF YOU HAVE QUESTIONS ABOUT YOUR PRE-OP INSTRUCTIONS PLEASE CALL [phone].   If you received a COVID test during your pre-op visit  it is requested that you wear a mask when out in public, stay away from anyone that may not be feeling well and notify your surgeon if you develop symptoms. If you test  positive for Covid or have been in contact with anyone that has tested positive in the last 10 days please notify you surgeon.    [facility] - Preparing for Surgery Before surgery, you can play an important role.  Because skin is not sterile, your skin needs to be as free of germs as possible.  You can reduce the number of germs on your skin by washing with CHG (chlorahexidine gluconate) soap before surgery.  CHG is an antiseptic cleaner which kills germs and bonds with the skin to continue killing germs even after washing. Please DO NOT use if you have an allergy to CHG or antibacterial soaps.  If your skin becomes reddened/irritated stop using the CHG and inform your nurse when you arrive at Short Stay. Do not shave (including legs and underarms) for at least 48 hours prior to the first CHG shower.  You may shave your face/neck.  Please follow these instructions carefully:  1.  Shower with CHG Soap the night before surgery and the morning of surgery.  2.  If you choose to wash your hair, wash your hair first as usual with your normal  shampoo.  3.  After you  shampoo, rinse your hair and body thoroughly to remove the shampoo.                             4.  Use CHG as you would any other liquid soap.  You can apply chg directly to the skin and wash.  Gently with a scrungie or clean washcloth.  5.  Apply the CHG Soap to your body ONLY FROM THE NECK DOWN.   Do   not use on face/ open                           Wound or open sores. Avoid contact with eyes, ears mouth and   genitals (private parts).                       Wash face,  Genitals (private parts) with your normal soap.             6.  Wash thoroughly, paying special attention to the area where your    surgery  will be performed.  7.  Thoroughly rinse your body with warm water from the neck down.  8.  DO NOT shower/wash with your normal soap after using and rinsing off the CHG Soap.                9.  Pat yourself dry with a clean towel.            10.  Wear clean pajamas.            11.  Place clean sheets on your bed the night of your first shower and do not  sleep with pets. Day of Surgery : Do not apply any CHG, lotions/deodorants the morning of surgery.  Please wear clean clothes to the hospital/surgery center.  FAILURE TO FOLLOW THESE INSTRUCTIONS MAY RESULT IN THE CANCELLATION OF YOUR SURGERY  PATIENT SIGNATURE_________________________________  NURSE SIGNATURE__________________________________  ________________________________________________________________________

## 2024-01-03 NOTE — Progress Notes (Signed)
 COVID Vaccine Completed: yes  Date of COVID positive in last 90 days:  PCP - Jacques Schroeder, MD Cardiologist - n/a  Chest x-ray - N/A EKG - 01/06/24 Epic/chart Stress Test - N/A ECHO - N/A Cardiac Cath - n/a Pacemaker/ICD device last checked:N/A Spinal Cord Stimulator:N/A  Bowel Prep - fleet enema night before  Sleep Study - N/A CPAP -   Fasting Blood Sugar - N/A Checks Blood Sugar _____ times a day  Last dose of GLP1 agonist-  N/A GLP1 instructions:  Do not take after     Last dose of SGLT-2 inhibitors-  N/A SGLT-2 instructions:  Do not take after     Blood Thinner Instructions: N/A Last dose:   Time: Aspirin Instructions: ASA 81, hold 5-7 days Last Dose:  Activity level: Can go up a flight of stairs and perform activities of daily living without stopping and without symptoms of chest pain or shortness of breath.  Anesthesia review: abnormal EKG  Patient denies shortness of breath, fever, cough and chest pain at PAT appointment  Patient verbalized understanding of instructions that were given to them at the PAT appointment. Patient was also instructed that they will need to review over the PAT instructions again at home before surgery.

## 2024-01-06 ENCOUNTER — Other Ambulatory Visit: Payer: Self-pay

## 2024-01-06 ENCOUNTER — Other Ambulatory Visit: Payer: Self-pay | Admitting: Family Medicine

## 2024-01-06 ENCOUNTER — Encounter (HOSPITAL_COMMUNITY): Payer: Self-pay

## 2024-01-06 ENCOUNTER — Encounter (HOSPITAL_COMMUNITY)
Admission: RE | Admit: 2024-01-06 | Discharge: 2024-01-06 | Disposition: A | Discharge status: Home / Self Care | Source: Ambulatory Visit | Attending: Urology | Admitting: Urology

## 2024-01-06 VITALS — BP 153/86 | HR 73 | Temp 98.0°F | Resp 16 | Ht 66.0 in | Wt 192.0 lb

## 2024-01-06 DIAGNOSIS — I10 Essential (primary) hypertension: Secondary | ICD-10-CM | POA: Insufficient documentation

## 2024-01-06 DIAGNOSIS — Z01810 Encounter for preprocedural cardiovascular examination: Secondary | ICD-10-CM | POA: Insufficient documentation

## 2024-01-06 DIAGNOSIS — Z01818 Encounter for other preprocedural examination: Secondary | ICD-10-CM | POA: Diagnosis not present

## 2024-01-06 DIAGNOSIS — R9431 Abnormal electrocardiogram [ECG] [EKG]: Secondary | ICD-10-CM | POA: Diagnosis not present

## 2024-01-06 LAB — TESTOS,TOTAL,FREE AND SHBG (FEMALE)
Free Testosterone: 185.5 pg/mL — ABNORMAL HIGH (ref 35.0–155.0)
Sex Hormone Binding: 27 nmol/L (ref 22–77)
Testosterone, Total, LC-MS-MS: 821 ng/dL (ref 250–1100)

## 2024-01-07 ENCOUNTER — Encounter (HOSPITAL_COMMUNITY): Payer: Self-pay | Admitting: Physician Assistant

## 2024-01-08 ENCOUNTER — Encounter: Payer: BC Managed Care – PPO | Admitting: Family Medicine

## 2024-01-12 NOTE — Progress Notes (Unsigned)
 Max Drummond T. Javonne Louissaint, MD, CAQ Sports Medicine [facility] [address] [place], [address]  Phone: [phone]  FAX: [phone]  Max Peters - 63 y.o. male  MRN [id number]  Date of Birth: 20-Jan-1961  Date: 01/15/2024  PCP: Watt Mirza, MD  Referral: Watt Mirza, MD  Chief Complaint  Patient presents with   Annual Exam   Patient Care Team: Watt Mirza, MD as PCP - General (Family Medicine) Subjective:   Max Peters is a 63 y.o. pleasant patient who presents with the following:  Discussed the use of AI scribe software for clinical note transcription with the patient, who gave verbal consent to proceed.  History of Present Illness Max Peters is a 63 year old male who presents for an annual physical exam.  He is scheduled for a prostate biopsy due to elevated PSA levels and is anxious about the procedure, requiring anesthesia due to this anxiety. He has experienced difficulty with MRI and ultrasound in the past due to nervousness. He recently stopped testosterone  injections, which he believes may have contributed to the elevated PSA levels.  He was not receiving testosterone  from a medical provider, but he was taking anabolic steroids.  An abnormal ECG was noted during a preoperative evaluation, raising concerns about potential cardiac issues. He is particularly worried due to his wife's experience with further testing after an abnormal ECG. No chest pain, shortness of breath, or hospital visits related to cardiac issues, but he feels anxious about the possibility of heart problems, especially after hearing about friends experiencing heart attacks.  He frequently takes ibuprofen, about four times a week, especially on workout days. He has a history of chronic kidney disease with worsening function compared to last year.  He experiences anxiety, particularly at night, and takes half a tablet of diazepam  on many nights to  manage this. No recent panic attacks, but there is a general sense of nervousness and worry, particularly about his heart and prostate health.  He has a history of sleep issues and has been taking a combination of amitriptyline  and trazodone  for years. He has not tried trazodone  alone for sleep and is concerned about stopping amitriptyline , which he has been using to aid sleep.  Preventative Health Maintenance Visit:  Health Maintenance Summary Reviewed and updated, unless pt declines services.  Tobacco History Reviewed. Alcohol: No concerns, no excessive use Exercise Habits: Some activity, rec at least 30 mins 5 times a week STD concerns: no risk or activity to increase risk Drug Use: Anabolics  Mael is a very well-known patient.  He is generally quite active gentleman and routinely works out.  Repeat colonoscopy Prevnar 20  He has an upcoming prostate biopsy.  Most recent PSA was 7.3.  Testosterone  recently was actually quite elevated.  Total is 821, but the free testosterone  was actually 186.  Chronic kidney disease stage III. Lab Results  Component Value Date   NA 136 01/01/2024   K 4.1 01/01/2024   CO2 31 01/01/2024   GLUCOSE 88 01/01/2024   Peters 31 (H) 01/01/2024   CREATININE 1.63 (H) 01/01/2024   CALCIUM 8.9 01/01/2024   GFR 44.42 (L) 01/01/2024   GFRNONAA 49.09 12/10/2008     Email Matt Eskridge about delaying anesthesia  Health Maintenance  Topic Date Due   Colonoscopy  06/02/2023   COVID-19 Vaccine (4 - 2025-26 season) 11/18/2023   Influenza Vaccine  06/16/2024 (Originally 10/18/2023)   DTaP/Tdap/Td (2 - Td or  Tdap) 03/15/2025   Pneumococcal Vaccine: 50+ Years  Completed   Hepatitis C Screening  Completed   HIV Screening  Completed   Zoster Vaccines- Shingrix   Completed   Hepatitis B Vaccines 19-59 Average Risk  Aged Out   HPV VACCINES  Aged Out   Meningococcal B Vaccine  Aged Out   Immunization History  Administered Date(s) Administered    Influenza,inj,Quad PF,6+ Mos 01/07/2013, 03/16/2015   Influenza-Unspecified 01/08/2014   PFIZER(Purple Top)SARS-COV-2 Vaccination 06/05/2019, 07/01/2019, 02/16/2020   PNEUMOCOCCAL CONJUGATE-20 01/15/2024   Tdap 03/16/2015   Zoster Recombinant(Shingrix ) 11/09/2021, 01/09/2022   Patient Active Problem List   Diagnosis Date Noted   Chronic kidney disease (CKD), stage III (moderate) (HCC) 01/11/2014    Priority: Medium    Essential hypertension, benign 11/16/2008    Priority: Medium    GAD (generalized anxiety disorder) 10/11/2023    Priority: Low   Hypogonadism in male 07/14/2012    Priority: Low   Anxiety attack 11/07/2010    Priority: Low   Chronic insomnia 11/07/2010    Priority: Low   Allergic rhinitis 03/23/2010    Priority: Low   BPH without obstruction/lower urinary tract symptoms 07/14/2012   ED (erectile dysfunction) of organic origin 01/15/2012    Past Medical History:  Diagnosis Date   Anxiety    Occasional nighttime   Chronic kidney disease (CKD), stage III (moderate) (HCC) 01/11/2014   Hypertension    Hypogonadism male     Past Surgical History:  Procedure Laterality Date   COLONOSCOPY     SHOULDER SURGERY      Family History  Problem Relation Age of Onset   Colon cancer Neg Hx    Kidney cancer Neg Hx    Prostate cancer Neg Hx    Bladder Cancer Neg Hx     Social History   Social History Narrative   Regular exercise--yes    Past Medical History, Surgical History, Social History, Family History, Problem List, Medications, and Allergies have been reviewed and updated if relevant.  Review of Systems: Pertinent positives are listed above.  Otherwise, a full 14 point review of systems has been done in full and it is negative except where it is noted positive.  Objective:   BP (!) 140/100   Pulse 70   Temp 98.4 F (36.9 C) (Temporal)   Ht 5' 5.5 (1.664 m)   Wt 191 lb 4 oz (86.8 kg)   SpO2 97%   BMI 31.34 kg/m  Ideal Body Weight: Weight in  (lb) to have BMI = 25: 152.2  Ideal Body Weight: Weight in (lb) to have BMI = 25: 152.2 No results found.    01/15/2024    8:25 AM 10/11/2023    2:24 PM 01/03/2023    9:01 AM 11/09/2021    9:04 AM 10/31/2020    8:11 AM  Depression screen PHQ 2/9  Decreased Interest 0 0 0 0 0  Down, Depressed, Hopeless 0 1 0 0 0  PHQ - 2 Score 0 1 0 0 0  Altered sleeping  3     Tired, decreased energy  1     Change in appetite  0     Feeling bad or failure about yourself   0     Trouble concentrating  0     Moving slowly or fidgety/restless  1     Suicidal thoughts  0     PHQ-9 Score  6     Difficult doing work/chores  Somewhat difficult  GEN: well developed, well nourished, no acute distress Eyes: conjunctiva and lids normal, PERRLA, EOMI ENT: TM clear, nares clear, oral exam WNL Neck: supple, no lymphadenopathy, no thyromegaly, no JVD Pulm: clear to auscultation and percussion, respiratory effort normal CV: regular rate and rhythm, S1-S2, no murmur, rub or gallop, no bruits, peripheral pulses normal and symmetric, no cyanosis, clubbing, edema or varicosities GI: soft, non-tender; no hepatosplenomegaly, masses; active bowel sounds all quadrants GU: deferred Lymph: no cervical, axillary or inguinal adenopathy MSK: gait normal, muscle tone and strength WNL, no joint swelling, effusions, discoloration, crepitus  SKIN: clear, good turgor, color WNL, no rashes, lesions, or ulcerations Neuro: normal mental status, normal strength, sensation, and motion Psych: alert; oriented to person, place and time, normally interactive and not anxious or depressed in appearance.  All labs reviewed with patient. Results for orders placed or performed in visit on 01/01/24  Testos,Total,Free and SHBG (Male)   Collection Time: 01/01/24  7:51 AM  Result Value Ref Range   Testosterone , Total, LC-MS-MS 821 250 - 1,100 ng/dL   Free Testosterone  185.5 (H) 35.0 - 155.0 pg/mL   Sex Hormone Binding 27 22 - 77  nmol/L  Hemoglobin A1c   Collection Time: 01/01/24  7:51 AM  Result Value Ref Range   Hgb A1c MFr Bld 5.8 4.6 - 6.5 %  Basic metabolic panel   Collection Time: 01/01/24  7:51 AM  Result Value Ref Range   Sodium 136 135 - 145 mEq/L   Potassium 4.1 3.5 - 5.1 mEq/L   Chloride 98 96 - 112 mEq/L   CO2 31 19 - 32 mEq/L   Glucose, Bld 88 70 - 99 mg/dL   Peters 31 (H) 6 - 23 mg/dL   Creatinine, Ser 8.36 (H) 0.40 - 1.50 mg/dL   GFR 55.57 (L) >39.99 mL/min   Calcium 8.9 8.4 - 10.5 mg/dL  Lipid panel   Collection Time: 01/01/24  7:51 AM  Result Value Ref Range   Cholesterol 97 0 - 200 mg/dL   Triglycerides 27.9 0.0 - 149.0 mg/dL   HDL 57.29 >60.99 mg/dL   VLDL 85.5 0.0 - 59.9 mg/dL   LDL Cholesterol 40 0 - 99 mg/dL   Total CHOL/HDL Ratio 2    NonHDL 53.92   CBC with Differential/Platelet   Collection Time: 01/01/24  7:51 AM  Result Value Ref Range   WBC 4.2 4.0 - 10.5 K/uL   RBC 4.81 4.22 - 5.81 Mil/uL   Hemoglobin 15.4 13.0 - 17.0 g/dL   HCT 53.9 60.9 - 47.9 %   MCV 95.7 78.0 - 100.0 fl   MCHC 33.4 30.0 - 36.0 g/dL   RDW 84.5 88.4 - 84.4 %   Platelets 234.0 150.0 - 400.0 K/uL   Neutrophils Relative % 52.3 43.0 - 77.0 %   Lymphocytes Relative 28.2 12.0 - 46.0 %   Monocytes Relative 15.5 (H) 3.0 - 12.0 %   Eosinophils Relative 3.7 0.0 - 5.0 %   Basophils Relative 0.3 0.0 - 3.0 %   Neutro Abs 2.2 1.4 - 7.7 K/uL   Lymphs Abs 1.2 0.7 - 4.0 K/uL   Monocytes Absolute 0.6 0.1 - 1.0 K/uL   Eosinophils Absolute 0.2 0.0 - 0.7 K/uL   Basophils Absolute 0.0 0.0 - 0.1 K/uL  Hepatic Function Panel   Collection Time: 01/01/24  7:51 AM  Result Value Ref Range   Total Bilirubin 0.5 0.2 - 1.2 mg/dL   Bilirubin, Direct 0.1 0.0 - 0.3 mg/dL   Alkaline Phosphatase 52  39 - 117 U/L   AST 38 (H) 0 - 37 U/L   ALT 27 0 - 53 U/L   Total Protein 6.9 6.0 - 8.3 g/dL   Albumin 4.2 3.5 - 5.2 g/dL  PSA, Total with Reflex to PSA, Free   Collection Time: 01/01/24  7:52 AM  Result Value Ref Range   PSA,  Total 7.3 (H) < OR = 4.0 ng/mL  reflex PSA, Free   Collection Time: 01/01/24  7:52 AM  Result Value Ref Range   PSA, Free 0.8 ng/mL   PSA, % Free 11 (L) >25 % (calc)    Assessment and Plan:     ICD-10-CM   1. Healthcare maintenance  Z00.00     2. Other cardiac arrhythmia  I49.8 Ambulatory referral to Cardiology    3. Abnormal EKG  R94.31 Ambulatory referral to Cardiology    4. Screening for malignant neoplasm of colon  Z12.11 Cologuard    5. Need for vaccination against Streptococcus pneumoniae  Z23 Pneumococcal conjugate vaccine 20-valent (Prevnar 20)     Assessment & Plan Adult Wellness Visit Routine wellness visit addressing multiple health concerns. - Administer pneumonia vaccine. - Discuss colon cancer screening options, including Cologuard.  Cardiac arrhythmia with abnormal electrocardiogram Irregular heartbeat on EKG without chest pain or dyspnea. Potential non-fatal arrhythmia requiring cardiology evaluation. Prostate biopsy delayed due to anesthesia concerns. -I think that this is likely a sinus arrhythmia, but I am not sure. - Refer to cardiology for further evaluation of arrhythmia. - Delay prostate biopsy until cardiology evaluation is complete.  Chronic kidney disease Slight decline in kidney function. - Advise to avoid NSAIDs such as ibuprofen and Aleve. - Recommend using Tylenol for pain management.  Prediabetes Blood sugar levels improved from 6.1 to 5.8 but remain in prediabetic range.  Generalized anxiety disorder Anxiety primarily at night, managed with diazepam  as needed. No recent panic attacks. Discussed daily anti-anxiety medication, but he prefers not to start any daily medication or antianxiety or antidepressant - Refill diazepam  prescription.  Chronic insomnia Managed with amitriptyline  and trazodone . Amitriptyline  to be discontinued due to potential impact on heart rhythm. Discussed using trazodone  alone and increasing dose if needed. Consider  Lunesta or Ambien if trazodone  is ineffective. - Discontinue amitriptyline . - Continue trazodone , increase dose if needed. - Consider use of Lunesta or Ambien if trazodone  is ineffective.  Prostate cancer workup (elevated PSA, biopsy pending) Elevated PSA with pending biopsy. Biopsy delayed until cardiology evaluation is complete due to anesthesia concerns. - Communicate with urology to delay prostate biopsy until after cardiology evaluation.  I recommended that patient stop exogenous testosterone .  Health Maintenance Exam: The patient's preventative maintenance and recommended screening tests for an annual wellness exam were reviewed in full today. Brought up to date unless services declined.  Counselled on the importance of diet, exercise, and its role in overall health and mortality. The patient's FH and SH was reviewed, including their home life, tobacco status, and drug and alcohol status.  Follow-up in 1 year for physical exam or additional follow-up below.  Disposition: Return in about 3 months (around 04/16/2024).  Meds ordered this encounter  Medications   diazepam  (VALIUM ) 2 MG tablet    Sig: Take 1 tablet (2 mg total) by mouth every 6 (six) hours as needed for anxiety.    Dispense:  30 tablet    Refill:  1   Medications Discontinued During This Encounter  Medication Reason   cholecalciferol (VITAMIN D3) 25 MCG (1000 UNIT) tablet  Dose change   amitriptyline  (ELAVIL ) 25 MG tablet    diazepam  (VALIUM ) 2 MG tablet Reorder   Orders Placed This Encounter  Procedures   Pneumococcal conjugate vaccine 20-valent (Prevnar 20)   Cologuard   Ambulatory referral to Cardiology    Signed,  Jacques T. Malaika Arnall, MD   Allergies as of 01/15/2024   No Known Allergies      Medication List        Accurate as of January 15, 2024  2:13 PM. If you have any questions, ask your nurse or doctor.          STOP taking these medications    amitriptyline  25 MG tablet Commonly  known as: ELAVIL  Stopped by: Jacques Chaz Mcglasson       TAKE these medications    aspirin EC 81 MG tablet Take 81 mg by mouth daily. Swallow whole.   caffeine 200 MG Tabs tablet Take 200 mg by mouth daily.   carvedilol  3.125 MG tablet Commonly known as: COREG  TAKE 1 TABLET BY MOUTH TWICE DAILY   diazepam  2 MG tablet Commonly known as: Valium  Take 1 tablet (2 mg total) by mouth every 6 (six) hours as needed for anxiety.   Fish Oil 1000 MG Caps Take 1,000 mg by mouth daily.   hydrochlorothiazide  12.5 MG tablet Commonly known as: HYDRODIURIL  TAKE 1 TABLET BY MOUTH ONCE DAILY   lisinopril  20 MG tablet Commonly known as: ZESTRIL  TAKE 1 TABLET BY MOUTH TWICE DAILY   multivitamin with minerals Tabs tablet Take 1 tablet by mouth daily.   tadalafil  5 MG tablet Commonly known as: CIALIS  Take 5 mg by mouth daily.   tamsulosin  0.4 MG Caps capsule Commonly known as: FLOMAX  TAKE ONE CAPSULE DAILY 30 MINUTES AFTER THE SAME MEAL EACH DAY   traZODone  100 MG tablet Commonly known as: DESYREL  TAKE ONE TABLET AT BEDTIME   vitamin C 1000 MG tablet Take 1,000 mg by mouth daily.   Vitamin D-3 125 MCG (5000 UT) Tabs Take 1 tablet by mouth daily. What changed: Another medication with the same name was removed. Continue taking this medication, and follow the directions you see here. Changed by: Jacques Viaan Knippenberg

## 2024-01-15 ENCOUNTER — Ambulatory Visit (INDEPENDENT_AMBULATORY_CARE_PROVIDER_SITE_OTHER): Admitting: Family Medicine

## 2024-01-15 ENCOUNTER — Encounter: Payer: Self-pay | Admitting: Family Medicine

## 2024-01-15 VITALS — BP 140/100 | HR 70 | Temp 98.4°F | Ht 65.5 in | Wt 191.2 lb

## 2024-01-15 DIAGNOSIS — I498 Other specified cardiac arrhythmias: Secondary | ICD-10-CM | POA: Diagnosis not present

## 2024-01-15 DIAGNOSIS — Z0000 Encounter for general adult medical examination without abnormal findings: Secondary | ICD-10-CM

## 2024-01-15 DIAGNOSIS — Z1211 Encounter for screening for malignant neoplasm of colon: Secondary | ICD-10-CM

## 2024-01-15 DIAGNOSIS — R9431 Abnormal electrocardiogram [ECG] [EKG]: Secondary | ICD-10-CM

## 2024-01-15 DIAGNOSIS — Z23 Encounter for immunization: Secondary | ICD-10-CM | POA: Diagnosis not present

## 2024-01-15 MED ORDER — DIAZEPAM 2 MG PO TABS: 2.0000 mg | ORAL_TABLET | Freq: Four times a day (QID) | ORAL | 1 refills | Status: AC | PRN

## 2024-01-15 NOTE — Patient Instructions (Addendum)
 Stop Amitryptiline  Try taking 1 trazodone  before bed You can increase to 1 1/2 tablets If needed, you can increase to 2 tablets

## 2024-01-20 ENCOUNTER — Telehealth: Payer: Self-pay | Admitting: Family Medicine

## 2024-01-20 NOTE — Telephone Encounter (Signed)
   Pre-operative Risk Assessment    Patient Name: Max Peters  DOB: 05-14-1960 MRN: [id number]   Date of last office visit: None  Date of next office visit: TBD    Request for Surgical Clearance    Procedure:  Transrectal ultrasound prostate biopsy   Date of Surgery:  Clearance TBD                                Surgeon:  Donnice Brooks  Surgeon's Group or Practice Name:  [facility]  Phone number:  [phone] Fax number:  [phone]   Type of Clearance Requested:   - Medical  - Pharmacy:  Hold office not sure       Type of Anesthesia:  MAC   Additional requests/questions:    [name]   01/20/2024, 9:42 AM

## 2024-01-20 NOTE — Telephone Encounter (Signed)
 I will have preop APP review pt is new pt and will pt need MD new pt appt see HF1st provider.

## 2024-01-20 NOTE — Telephone Encounter (Signed)
 Patient is appropriate for [facility], referral for abnormal EKG and further risk stratification.

## 2024-01-20 NOTE — Telephone Encounter (Signed)
 Pt has been scheduled to see Rollo Louder, [facility] ok per preop APP Delon Hoover, NP pt to see HF1st provider for preop clearance.   Pt has been given the address to [address]   I will update all parties involved.

## 2024-01-20 NOTE — Telephone Encounter (Signed)
   Name: Max Peters  DOB: Feb 10, 1961  MRN: [id number]  Primary Cardiologist: None  Chart reviewed as part of pre-operative protocol coverage. Because of [name]'s past medical history and recent referral to [facility], he will require a follow-up in-office visit in order to better assess preoperative cardiovascular risk.  Pre-op covering staff: - Please schedule appointment and call patient to inform them. If patient already had an upcoming appointment within acceptable timeframe, please add pre-op clearance to the appointment notes so provider is aware. - Please contact requesting surgeon's office via preferred method (i.e, phone, fax) to inform them of need for appointment prior to surgery.    [name], NP  01/20/2024, 12:07 PM

## 2024-01-21 ENCOUNTER — Other Ambulatory Visit: Payer: Self-pay | Admitting: Family Medicine

## 2024-01-22 NOTE — Progress Notes (Addendum)
 " Cardiology Office Note   Date:  01/27/2024  ID:  Max Peters, DOB [birth year], MRN [id number] PCP: Watt Mirza, MD  [facility] Providers Cardiologist:  Darryle ONEIDA Decent, MD   History of Present Illness Max Peters is a 63 y.o. male with past medical history of anxiety, CKD, insomnia.  Patient presents today for a preop evaluation.  Patient was recently seen by urology for evaluation of elevated PSA levels.  Was scheduled for a prostate biopsy.  Due to his anxiety, patient does require anesthesia for the procedure. Underwent EKG on 01/06/2024 that showed sinus rhythm with frequent PACs.  Right axis deviation, T wave inversions in lead I, II, aVF.  Was referred to cardiology for further evaluation  Today, patient presents to establish care with cardiology and for preoperative evaluation.  He is pending a prostate biopsy.  Will have general anesthesia for this procedure.  Was seen by his primary care provider and had an abnormal EKG.  Was sent to cardiology for further evaluation.  Patient denies having any chest pain.  However, he does have left-sided shoulder pain.  Left-sided shoulder pain seems to be worse at night when he is laying in bed.  Also seem to worsen after he got his pneumonia shot a few weeks ago.  He believes this is musculoskeletal. However, ever since he was told that his EKG was abnormal, he has had a heightened awareness of left chest/shoulder pain. Says that if he ever has a twinge, he is worried that he is having a heart attack. He denies shortness of breath.  Denies palpitations, dizziness, syncope, near syncope.  He has a history of high blood sugar.  BP elevated today.  He works out by [employer] about 3 times per week.  Able to tolerate this activity well.   Studies Reviewed EKG Interpretation Date/Time:  Monday January 27 2024 10:43:09 EST Ventricular Rate:  69 PR Interval:  158 QRS Duration:  106 QT Interval:  368 QTC Calculation: 394 R  Axis:   -20  Text Interpretation: Normal sinus rhythm with sinus arrhythmia Incomplete right bundle branch block Minimal voltage criteria for LVH, may be normal variant ( R in aVL ) Septal infarct , age undetermined When compared with ECG of 06-Jan-2024 09:17, Incomplete right bundle branch block is now Present Septal infarct is now Present Non-specific change in ST segment in Lateral leads Confirmed by Vicci Sauer [phone]) on 01/27/2024 12:02:02 PM     Risk Assessment/Calculations   HYPERTENSION CONTROL Vitals:   01/27/24 1036 01/27/24 1202  BP: (!) 150/100 (!) 140/92    The patient's blood pressure is elevated above target today.  In order to address the patient's elevated BP: A current anti-hypertensive medication was adjusted today.          Physical Exam VS:  BP (!) 140/92   Pulse 69   Ht 5' 5.5 (1.664 m)   Wt 196 lb 12.8 oz (89.3 kg)   SpO2 94%   BMI 32.25 kg/m        Wt Readings from Last 3 Encounters:  01/27/24 196 lb 12.8 oz (89.3 kg)  01/15/24 191 lb 4 oz (86.8 kg)  01/06/24 192 lb (87.1 kg)    GEN: Well nourished, well developed in no acute distress. Sitting comfortably on the exam table  NECK: No JVD  CARDIAC:  RRR, no murmurs, rubs, gallops RESPIRATORY:  Clear to auscultation without rales, wheezing or rhonchi. Normal WOB on room air   ABDOMEN: Soft,  non-tender, non-distended EXTREMITIES:  No edema in BLE; No deformity   ASSESSMENT AND PLAN  PACs  Abnormal EKG  - EKG through PCP on 01/06/2024 showed sinus rhythm with PACs, T wave inversions in leads I, II, aVF, aVL - EKG today showed sinus rhythm with sinus arrhythmia, incomplete right bundle branch block - Labs on 01/01/2024 showed K4.1 - Ordered 3-day monitor to better assess PAC burden  - Discussed triggers for PACs including dehydration, alcohol, stress, caffeine - Increasing carvedilol  to 6.25 mg twice daily - Ordered echocardiogram  Left Shoulder Pain  - Patient has been having issues  with left sided shoulder pain.  It has increased in the past few weeks.  Tends to occur when laying in bed at night. - Patient suspects that shoulder pain is musculoskeletal.  However, ever since he was told by PCP that his EKG was abnormal, he has been very concerned about any left-sided chest/shoulder pain.  With any twinge,he fears that he might be having a heart attack - Pain is atypical for cardiac cause.  However, patient does have risk factors for CAD including hypertension, family history.  EKG shows incomplete RBBB, cannot rule out septal infarct - Ordered coronary CTA  - Ordered BMP prior to scan  - Ordered echocardiogram - Continue aspirin 81 mg daily - LDL 40 on 01/01/2024  Preop Evaluation  - Patient pending prostate biopsy.  He will be under general anesthesia for the surgery. - Clearance pending coronary CTA as above. PACs should not delay procedure  ADDENDUM 12/22- Echo and coronary CTA reassuring. OK to proceed with surgery without further cardiac workup     HTN  - BP elevated  - Increase carvedilol  to 6.25 mg twice daily -Continue lisinopril  20 mg daily, hydrochlorothiazide  12.5 mg daily -K4.1 and creatinine 1.63 on 01/01/2024.  When these labs were drawn, patient has been taking quite a bit of ibuprofen due to left shoulder pain.  Ordered BMP today to recheck renal function     Dispo: Follow-up in 2 months with Dr. Barbaraann  Signed, Rollo FABIENE Louder, PA-C   "

## 2024-01-24 ENCOUNTER — Ambulatory Visit (HOSPITAL_COMMUNITY): Admission: RE | Admit: 2024-01-24 | Source: Ambulatory Visit | Admitting: Urology

## 2024-01-24 ENCOUNTER — Ambulatory Visit (HOSPITAL_COMMUNITY)

## 2024-01-24 ENCOUNTER — Encounter (HOSPITAL_COMMUNITY): Admission: RE | Payer: Self-pay | Source: Ambulatory Visit

## 2024-01-24 DIAGNOSIS — Z1211 Encounter for screening for malignant neoplasm of colon: Secondary | ICD-10-CM | POA: Diagnosis not present

## 2024-01-24 SURGERY — CYSTOSCOPY
Anesthesia: Monitor Anesthesia Care

## 2024-01-25 ENCOUNTER — Other Ambulatory Visit: Payer: Self-pay | Admitting: Family Medicine

## 2024-01-27 ENCOUNTER — Ambulatory Visit: Attending: Cardiology | Admitting: Cardiology

## 2024-01-27 ENCOUNTER — Ambulatory Visit

## 2024-01-27 ENCOUNTER — Encounter: Payer: Self-pay | Admitting: Cardiology

## 2024-01-27 VITALS — BP 140/92 | HR 69 | Ht 65.5 in | Wt 196.8 lb

## 2024-01-27 DIAGNOSIS — Z01810 Encounter for preprocedural cardiovascular examination: Secondary | ICD-10-CM

## 2024-01-27 DIAGNOSIS — I10 Essential (primary) hypertension: Secondary | ICD-10-CM

## 2024-01-27 DIAGNOSIS — I491 Atrial premature depolarization: Secondary | ICD-10-CM

## 2024-01-27 DIAGNOSIS — R072 Precordial pain: Secondary | ICD-10-CM

## 2024-01-27 MED ORDER — CARVEDILOL 6.25 MG PO TABS: 6.2500 mg | ORAL_TABLET | Freq: Two times a day (BID) | ORAL | 3 refills | Status: AC

## 2024-01-27 MED ORDER — METOPROLOL TARTRATE 100 MG PO TABS: 100.0000 mg | ORAL_TABLET | Freq: Once | ORAL | 0 refills | Status: DC

## 2024-01-27 NOTE — Patient Instructions (Signed)
 Medication Instructions:  Your physician has recommended you make the following change in your medication:   INCREASE the Carvedilol  to 6.25 taking 1 twice a day  *If you need a refill on your cardiac medications before your next appointment, please call your pharmacy*  Lab Work: TODAY:  BMET  If you have labs (blood work) drawn today and your tests are completely normal, you will receive your results only by: MyChart Message (if you have MyChart) OR A paper copy in the mail If you have any lab test that is abnormal or we need to change your treatment, we will call you to review the results.  Testing/Procedures: Your physician has requested that you have an echocardiogram. Echocardiography is a painless test that uses sound waves to create images of your heart. It provides your doctor with information about the size and shape of your heart and how well your heart's chambers and valves are working. This procedure takes approximately one hour. There are no restrictions for this procedure. Please do NOT wear cologne, perfume, aftershave, or lotions (deodorant is allowed). Please arrive 15 minutes prior to your appointment time.  Please note: We ask at that you not bring children with you during ultrasound (echo/ vascular) testing. Due to room size and safety concerns, children are not allowed in the ultrasound rooms during exams. Our front office staff cannot provide observation of children in our lobby area while testing is being conducted. An adult accompanying a patient to their appointment will only be allowed in the ultrasound room at the discretion of the ultrasound technician under special circumstances. We apologize for any inconvenience.    Your physician has requested that you have cardiac CT. Cardiac computed tomography (CT) is a painless test that uses an x-ray machine to take clear, detailed pictures of your heart. For further information please visit [email or web address] Please  follow instruction sheet BELOW:    Your cardiac CT will be scheduled at one of the below locations:   [facility] [address] [place], [place] [address] [phone] (Severe contrast allergies only)  OR   [facility] [address] [place], [place] [address] [phone]  OR   [facility] [address] [place], [place] [address] [phone]  OR   [facility] and Vascular Tower [address]  [place], [place] [address]  OR   [facility] [address] [place], [place] [phone]  If scheduled at [facility], please arrive at the [facility] and Children's Entrance (Entrance C2) of [facility] 30 minutes prior to test start time. You can use the FREE valet parking offered at entrance C (encouraged to control the heart rate for the test)  Proceed to the [facility] Radiology Department (first floor) to check-in and test prep.  All radiology patients and guests should use entrance C2 at [facility], accessed from [facility], even though the hospital's physical address listed is [address]  If scheduled at the Heart and Vascular Tower at [address], please enter the parking lot using the [address] entrance and use the FREE valet service at the patient drop-off area. Enter the building and check-in with registration on the main floor.  If scheduled at [facility], please arrive to the Heart and Vascular Center 15 mins early for check-in and test prep.  There is spacious parking and easy access to the radiology department from the [facility] Heart and  Vascular entrance. Please enter here and check-in with the desk attendant.   If scheduled at [facility], please arrive 30 minutes early for check-in and test prep.  Please follow these instructions carefully (unless otherwise directed):  An IV will be required for  this test and Nitroglycerin will be given.  Hold all erectile dysfunction medications at least 3 days (72 hrs) prior to test. (Ie viagra , cialis , sildenafil , tadalafil , etc)   On the Night Before the Test: Be sure to Drink plenty of water. Do not consume any caffeinated/decaffeinated beverages or chocolate 12 hours prior to your test. Do not take any antihistamines 12 hours prior to your test.   On the Day of the Test: Drink plenty of water until 1 hour prior to the test. Do not eat any food 1 hour prior to test. HOLD CARVEDILOL  AND HYDROCHLOROTHUAZIDE ON THE DAY OF THE CARDIAC CT  Take metoprolol (Lopressor) 100 MG two hours prior to test. THIS HAS BEEN SENT TO [facility]         After the Test: Drink plenty of water. After receiving IV contrast, you may experience a mild flushed feeling. This is normal. On occasion, you may experience a mild rash up to 24 hours after the test. This is not dangerous. If this occurs, you can take Benadryl 25 mg, Zyrtec, Claritin, or Allegra and increase your fluid intake. (Patients taking Tikosyn should avoid Benadryl, and may take Zyrtec, Claritin, or Allegra) If you experience trouble breathing, this can be serious. If it is severe call 911 IMMEDIATELY. If it is mild, please call our office.  We will call to schedule your test 2-4 weeks out understanding that some insurance companies will need an authorization prior to the service being performed.   For more information and frequently asked questions, please visit our website : [email or web address]  For non-scheduling related questions, please contact the cardiac imaging nurse navigator should you have any questions/concerns: Cardiac Imaging Nurse Navigators Direct Office Dial: [phone]   For scheduling needs, including cancellations and rescheduling, please call Brittany, [phone].   ZIO XT- Long Term Monitor Instructions  Your physician has requested you wear a  ZIO patch monitor for  3 days.  This is a single patch monitor. [employer] supplies one patch monitor per enrollment. Additional stickers are not available. Please do not apply patch if you will be having a Nuclear Stress Test,  Echocardiogram, Cardiac CT, MRI, or Chest Xray during the period you would be wearing the  monitor. The patch cannot be worn during these tests. You cannot remove and re-apply the  ZIO XT patch monitor.  Your ZIO patch monitor will be mailed 3 day USPS to your address on file. It may take 3-5 days  to receive your monitor after you have been enrolled.  Once you have received your monitor, please review the enclosed instructions. Your monitor  has already been registered assigning a specific monitor serial # to you.  Billing and Patient Assistance Program Information  We have supplied [employer] with any of your insurance information on file for billing purposes. [employer] offers a sliding scale Patient Assistance Program for patients that do not have  insurance, or whose insurance does not completely cover the cost of the ZIO monitor.  You must apply for the Patient Assistance Program to qualify for this discounted rate.  To apply, please call [employer] at [phone], select option 4, select option 2, ask to apply for  Patient Assistance Program. Meredeth will ask your  household income, and how many people  are in your household. They will quote your out-of-pocket cost based on that information.  [employer] will also be able to set up a 64-month, interest-free payment plan if needed.  Applying the monitor   Shave hair from upper left chest.  Hold abrader disc by orange tab. Rub abrader in 40 strokes over the upper left chest as  indicated in your monitor instructions.  Clean area with 4 enclosed alcohol pads. Let dry.  Apply patch as indicated in monitor instructions. Patch will be placed under collarbone on left  side of chest with arrow pointing upward.  Rub patch  adhesive wings for 2 minutes. Remove white label marked 1. Remove the white  label marked 2. Rub patch adhesive wings for 2 additional minutes.  While looking in a mirror, press and release button in center of patch. A small green light will  flash 3-4 times. This will be your only indicator that the monitor has been turned on.  Do not shower for the first 24 hours. You may shower after the first 24 hours.  Press the button if you feel a symptom. You will hear a small click. Record Date, Time and  Symptom in the Patient Logbook.  When you are ready to remove the patch, follow instructions on the last 2 pages of Patient  Logbook. Stick patch monitor onto the last page of Patient Logbook.  Place Patient Logbook in the blue and white box. Use locking tab on box and tape box closed  securely. The blue and white box has prepaid postage on it. Please place it in the mailbox as  soon as possible. Your physician should have your test results approximately 7 days after the  monitor has been mailed back to [facility].  Call [facility] Customer Care at [phone] if you have questions regarding  your ZIO XT patch monitor. Call them immediately if you see an orange light blinking on your  monitor.  If your monitor falls off in less than 4 days, contact our Monitor department at [phone].  If your monitor becomes loose or falls off after 4 days call [employer] at [phone] for  suggestions on securing your monitor   Follow-Up: At [facility], you and your health needs are our priority.  As part of our continuing mission to provide you with exceptional heart care, our providers are all part of one team.  This team includes your primary Cardiologist (physician) and Advanced Practice Providers or APPs (Physician Assistants and Nurse Practitioners) who all work together to provide you with the care you need, when you need it.  Your next appointment:   2 month(s)  Provider:    Darryle ONEIDA Decent, MD    We recommend signing up for the patient portal called MyChart.  Sign up information is provided on this After Visit Summary.  MyChart is used to connect with patients for Virtual Visits (Telemedicine).  Patients are able to view lab/test results, encounter notes, upcoming appointments, etc.  Non-urgent messages can be sent to your provider as well.   To learn more about what you can do with MyChart, go to [email or web address].   Other Instructions

## 2024-01-27 NOTE — Progress Notes (Unsigned)
 Enrolled patient for a 3 day Zio XT monitor to be mailed to patients home   Max Peters to read

## 2024-01-28 ENCOUNTER — Ambulatory Visit: Payer: Self-pay | Admitting: Cardiology

## 2024-01-28 LAB — BASIC METABOLIC PANEL WITH GFR
BUN/Creatinine Ratio: 13 (ref 10–24)
BUN: 20 mg/dL (ref 8–27)
CO2: 27 mmol/L (ref 20–29)
Calcium: 9.6 mg/dL (ref 8.6–10.2)
Chloride: 98 mmol/L (ref 96–106)
Creatinine, Ser: 1.57 mg/dL — ABNORMAL HIGH (ref 0.76–1.27)
Glucose: 89 mg/dL (ref 70–99)
Potassium: 4.5 mmol/L (ref 3.5–5.2)
Sodium: 138 mmol/L (ref 134–144)
eGFR: 49 mL/min/1.73 — ABNORMAL LOW (ref >59)

## 2024-01-31 ENCOUNTER — Ambulatory Visit: Payer: Self-pay | Admitting: Family Medicine

## 2024-01-31 LAB — COLOGUARD: COLOGUARD: NEGATIVE

## 2024-02-11 DIAGNOSIS — Z01810 Encounter for preprocedural cardiovascular examination: Secondary | ICD-10-CM | POA: Diagnosis not present

## 2024-02-11 DIAGNOSIS — I10 Essential (primary) hypertension: Secondary | ICD-10-CM | POA: Diagnosis not present

## 2024-02-18 ENCOUNTER — Other Ambulatory Visit: Payer: Self-pay | Admitting: Family Medicine

## 2024-02-18 DIAGNOSIS — I491 Atrial premature depolarization: Secondary | ICD-10-CM | POA: Diagnosis not present

## 2024-02-18 DIAGNOSIS — R072 Precordial pain: Secondary | ICD-10-CM

## 2024-02-18 DIAGNOSIS — I10 Essential (primary) hypertension: Secondary | ICD-10-CM | POA: Diagnosis not present

## 2024-02-18 DIAGNOSIS — Z01810 Encounter for preprocedural cardiovascular examination: Secondary | ICD-10-CM

## 2024-03-04 ENCOUNTER — Encounter (HOSPITAL_COMMUNITY): Payer: Self-pay

## 2024-03-06 ENCOUNTER — Ambulatory Visit (HOSPITAL_COMMUNITY)

## 2024-03-06 ENCOUNTER — Ambulatory Visit (HOSPITAL_COMMUNITY)
Admission: RE | Admit: 2024-03-06 | Discharge: 2024-03-06 | Disposition: A | Discharge status: Home / Self Care | Source: Ambulatory Visit | Attending: Cardiology | Admitting: Cardiology

## 2024-03-06 DIAGNOSIS — I491 Atrial premature depolarization: Secondary | ICD-10-CM | POA: Diagnosis not present

## 2024-03-06 DIAGNOSIS — R072 Precordial pain: Secondary | ICD-10-CM

## 2024-03-06 DIAGNOSIS — Z01810 Encounter for preprocedural cardiovascular examination: Secondary | ICD-10-CM

## 2024-03-06 DIAGNOSIS — I10 Essential (primary) hypertension: Secondary | ICD-10-CM

## 2024-03-06 LAB — ECHOCARDIOGRAM COMPLETE
Area-P 1/2: 4.1 cm2
S' Lateral: 3.4 cm

## 2024-03-06 MED ORDER — NITROGLYCERIN 0.4 MG SL SUBL
0.8000 mg | SUBLINGUAL_TABLET | Freq: Once | SUBLINGUAL | Status: AC
  Administered 2024-03-06: 0.8 mg via SUBLINGUAL

## 2024-03-06 MED ORDER — IOHEXOL 350 MG/ML SOLN
100.0000 mL | Freq: Once | INTRAVENOUS | Status: AC | PRN
  Administered 2024-03-06: 100 mL via INTRAVENOUS

## 2024-03-06 MED ORDER — NITROGLYCERIN 0.4 MG SL SUBL
SUBLINGUAL_TABLET | SUBLINGUAL | Status: AC
  Filled 2024-03-06: qty 2

## 2024-03-22 NOTE — Progress Notes (Unsigned)
 " Cardiology Office Note:  .   Date:  03/25/2024  ID:  Max Peters, DOB 10/15/1960, MRN [id number] PCP: Watt Mirza, MD  [facility] Providers Cardiologist:  Darryle ONEIDA Decent, MD   History of Present Illness: .    Chief Complaint  Patient presents with   Follow-up    Max Peters is a 64 y.o. male with below history who presents for follow-up.   History of Present Illness   Max Peters is a 64 year old male who presents for follow-up after preoperative evaluation revealed premature atrial contractions (PACs).  In November, during a preoperative evaluation, an EKG showed premature atrial contractions (PACs). A coronary CTA revealed minimal coronary artery disease with noncalcified plaque and a coronary calcium score of zero. An echocardiogram showed normal left ventricular function. A heart monitor indicated rare PACs, occurring less than one percent of the time. No episodes of chest pain or trouble breathing were reported.  He is currently on carvedilol  12.5 mg twice daily, lisinopril  20 mg twice daily, and hydrochlorothiazide  12.5 mg daily for hypertension. Despite these medications, his blood pressure has been difficult to control. He experiences some edema.  He does not take cholesterol medication, and his most recent LDL is 40, which is at goal. He takes a baby aspirin (81 mg) daily.  His family history includes his father having a quadruple bypass at 64 years old, but his father was overweight and did not exercise. He does not smoke, drinks alcohol occasionally, and exercises at least three times a week, primarily weight training. He works a sedentary job as a production designer, theatre/television/film at the [employer], is married, and has two grown children.           Problem List PACs Minimal CAD -<25% non-calcified plaque in the LAD 3. HTN 4. HLD -T chol 97, HDL 42, LDL 40, TG 72 5. CKD 3a     ROS: All other ROS reviewed and negative. Pertinent positives noted in the HPI.      Studies Reviewed: SABRA       TTE 03/06/2024  1. Left ventricular ejection fraction, by estimation, is 50 to 55%. The  left ventricle has low normal function. The left ventricle has no regional  wall motion abnormalities. Left ventricular diastolic parameters were  normal.   2. Right ventricular systolic function is normal. The right ventricular  size is normal. There is normal pulmonary artery systolic pressure. The  estimated right ventricular systolic pressure is 26.8 mmHg.   3. The mitral valve is grossly normal. Mild mitral valve regurgitation.  No evidence of mitral stenosis.   4. The aortic valve is tricuspid. There is mild thickening of the aortic  valve. Aortic valve regurgitation is trivial. No aortic stenosis is  present.   5. The inferior vena cava is normal in size with greater than 50%  respiratory variability, suggesting right atrial pressure of 3 mmHg.   CCTA 03/09/2024 IMPRESSION: 1. Coronary calcium score of 0. This was 1st percentile for age-, sex, and race-matched controls.   2. There is minimal (<25%) soft plaque in the mid LAD.  CAD-RADS 1.   3. Normal coronary origin with left dominance.   4. Consider non-ischemic causes of shoulder pain.   Physical Exam:   VS:  BP 120/86   Pulse (!) 56   Ht 5' 5.5 (1.664 m)   Wt 190 lb 6.4 oz (86.4 kg)   SpO2 97%   BMI 31.20 kg/m  Wt Readings from Last 3 Encounters:  03/25/24 190 lb 6.4 oz (86.4 kg)  01/27/24 196 lb 12.8 oz (89.3 kg)  01/15/24 191 lb 4 oz (86.8 kg)    GEN: Well nourished, well developed in no acute distress NECK: No JVD; No carotid bruits CARDIAC: RRR, no murmurs, rubs, gallops RESPIRATORY:  Clear to auscultation without rales, wheezing or rhonchi  ABDOMEN: Soft, non-tender, non-distended EXTREMITIES:  No edema; No deformity  ASSESSMENT AND PLAN: .   Assessment and Plan    Premature Atrial Contractions (PACs) Rare PACs identified, benign, no treatment required. - Reassured regarding  benign nature of PACs.  Coronary Artery Disease (CAD) Minimal noncalcified plaque, coronary calcium score zero, LDL at goal, no aspirin needed for primary prevention. - Discontinue aspirin therapy. - Encourage heart-healthy diet.  Hypertension Blood pressure well controlled with current medication regimen. - Continue carvedilol  12.5 mg BID. - Continue lisinopril  20 mg BID. - Continue hydrochlorothiazide  12.5 mg daily.  Preoperative Assessment - no further testing needed prior to surgery.  - Return to cardiology as needed.                Follow-up: Return if symptoms worsen or fail to improve.  Signed, Darryle DASEN. Barbaraann, MD, [facility]  [facility]  [address] [place], [place] [address] [phone]  8:59 AM   "

## 2024-03-25 ENCOUNTER — Ambulatory Visit: Attending: Cardiovascular Disease | Admitting: Cardiovascular Disease

## 2024-03-25 ENCOUNTER — Encounter: Payer: Self-pay | Admitting: Cardiovascular Disease

## 2024-03-25 VITALS — BP 120/86 | HR 56 | Ht 65.5 in | Wt 190.4 lb

## 2024-03-25 DIAGNOSIS — I491 Atrial premature depolarization: Secondary | ICD-10-CM

## 2024-03-25 DIAGNOSIS — I10 Essential (primary) hypertension: Secondary | ICD-10-CM | POA: Diagnosis not present

## 2024-03-25 DIAGNOSIS — E782 Mixed hyperlipidemia: Secondary | ICD-10-CM | POA: Diagnosis not present

## 2024-03-25 DIAGNOSIS — Z01810 Encounter for preprocedural cardiovascular examination: Secondary | ICD-10-CM

## 2024-03-25 NOTE — Patient Instructions (Signed)
 Medication Instructions:  STOP: Aspirin  *If you need a refill on your cardiac medications before your next appointment, please call your pharmacy*  Lab Work: NONE If you have labs (blood work) drawn today and your tests are completely normal, you will receive your results only by: MyChart Message (if you have MyChart) OR A paper copy in the mail If you have any lab test that is abnormal or we need to change your treatment, we will call you to review the results.  Testing/Procedures: NONE  Follow-Up: At [facility], you and your health needs are our priority.  As part of our continuing mission to provide you with exceptional heart care, our providers are all part of one team.  This team includes your primary Cardiologist (physician) and Advanced Practice Providers or APPs (Physician Assistants and Nurse Practitioners) who all work together to provide you with the care you need, when you need it.  Your next appointment:   As Needed

## 2024-03-31 ENCOUNTER — Encounter: Payer: Self-pay | Admitting: Family Medicine

## 2024-03-31 MED ORDER — TRAZODONE HCL 100 MG PO TABS: 150.0000 mg | ORAL_TABLET | Freq: Every day | ORAL | 1 refills | Status: AC

## 2024-04-06 ENCOUNTER — Other Ambulatory Visit: Payer: Self-pay | Admitting: Family Medicine

## 2024-04-07 ENCOUNTER — Other Ambulatory Visit: Payer: Self-pay | Admitting: Urology

## 2024-04-20 ENCOUNTER — Ambulatory Visit: Admitting: Family Medicine

## 2024-04-27 ENCOUNTER — Ambulatory Visit: Admitting: Family Medicine

## 2024-05-06 ENCOUNTER — Encounter (HOSPITAL_COMMUNITY)

## 2024-05-15 ENCOUNTER — Other Ambulatory Visit (HOSPITAL_COMMUNITY)

## 2024-05-15 ENCOUNTER — Ambulatory Visit (HOSPITAL_COMMUNITY): Admit: 2024-05-15 | Admitting: Urology

## 2024-05-15 SURGERY — CYSTOSCOPY
Anesthesia: Monitor Anesthesia Care
